# Patient Record
Sex: Female | Born: 1975 | Hispanic: Yes | Marital: Single | Smoking: Never smoker
Health system: Southern US, Community
[De-identification: ages and names within clinical notes are randomized; demographics above are authoritative.]

## PROBLEM LIST (undated history)

## (undated) DIAGNOSIS — R12 Heartburn: Secondary | ICD-10-CM

## (undated) DIAGNOSIS — Z789 Other specified health status: Secondary | ICD-10-CM

## (undated) DIAGNOSIS — K7581 Nonalcoholic steatohepatitis (NASH): Secondary | ICD-10-CM

## (undated) HISTORY — PX: COSMETIC SURGERY: SHX468

---

## 2013-10-18 ENCOUNTER — Ambulatory Visit: Payer: Worker's Compensation | Attending: Orthopedic Surgery | Admitting: Physical Therapy

## 2013-10-18 DIAGNOSIS — M25619 Stiffness of unspecified shoulder, not elsewhere classified: Secondary | ICD-10-CM | POA: Insufficient documentation

## 2013-10-18 DIAGNOSIS — IMO0001 Reserved for inherently not codable concepts without codable children: Secondary | ICD-10-CM | POA: Insufficient documentation

## 2013-10-18 DIAGNOSIS — M25519 Pain in unspecified shoulder: Secondary | ICD-10-CM | POA: Insufficient documentation

## 2013-10-22 ENCOUNTER — Ambulatory Visit: Payer: Worker's Compensation | Attending: Orthopedic Surgery | Admitting: Physical Therapy

## 2013-10-22 DIAGNOSIS — M25619 Stiffness of unspecified shoulder, not elsewhere classified: Secondary | ICD-10-CM | POA: Insufficient documentation

## 2013-10-22 DIAGNOSIS — IMO0001 Reserved for inherently not codable concepts without codable children: Secondary | ICD-10-CM | POA: Insufficient documentation

## 2013-10-22 DIAGNOSIS — M25519 Pain in unspecified shoulder: Secondary | ICD-10-CM | POA: Insufficient documentation

## 2013-10-25 ENCOUNTER — Ambulatory Visit: Payer: Worker's Compensation | Admitting: Physical Therapy

## 2013-10-27 ENCOUNTER — Ambulatory Visit: Payer: Worker's Compensation | Attending: Orthopedic Surgery | Admitting: Physical Therapy

## 2013-10-27 DIAGNOSIS — IMO0001 Reserved for inherently not codable concepts without codable children: Secondary | ICD-10-CM | POA: Insufficient documentation

## 2013-10-27 DIAGNOSIS — M25519 Pain in unspecified shoulder: Secondary | ICD-10-CM | POA: Insufficient documentation

## 2013-10-27 DIAGNOSIS — M25619 Stiffness of unspecified shoulder, not elsewhere classified: Secondary | ICD-10-CM | POA: Insufficient documentation

## 2013-12-16 ENCOUNTER — Encounter (HOSPITAL_COMMUNITY): Payer: Self-pay | Admitting: Emergency Medicine

## 2013-12-16 ENCOUNTER — Observation Stay (HOSPITAL_COMMUNITY)
Admission: EM | Admit: 2013-12-16 | Discharge: 2013-12-19 | Disposition: A | Discharge status: Home / Self Care | Payer: Medicaid Other | Attending: Internal Medicine | Admitting: Internal Medicine

## 2013-12-16 DIAGNOSIS — R0602 Shortness of breath: Secondary | ICD-10-CM | POA: Diagnosis not present

## 2013-12-16 DIAGNOSIS — R12 Heartburn: Secondary | ICD-10-CM | POA: Diagnosis present

## 2013-12-16 DIAGNOSIS — K8010 Calculus of gallbladder with chronic cholecystitis without obstruction: Principal | ICD-10-CM | POA: Insufficient documentation

## 2013-12-16 DIAGNOSIS — Z79899 Other long term (current) drug therapy: Secondary | ICD-10-CM | POA: Diagnosis not present

## 2013-12-16 DIAGNOSIS — K851 Biliary acute pancreatitis without necrosis or infection: Secondary | ICD-10-CM | POA: Diagnosis present

## 2013-12-16 DIAGNOSIS — Z01811 Encounter for preprocedural respiratory examination: Secondary | ICD-10-CM

## 2013-12-16 DIAGNOSIS — Z419 Encounter for procedure for purposes other than remedying health state, unspecified: Secondary | ICD-10-CM

## 2013-12-16 DIAGNOSIS — I10 Essential (primary) hypertension: Secondary | ICD-10-CM | POA: Diagnosis not present

## 2013-12-16 DIAGNOSIS — K812 Acute cholecystitis with chronic cholecystitis: Secondary | ICD-10-CM | POA: Diagnosis present

## 2013-12-16 DIAGNOSIS — R52 Pain, unspecified: Secondary | ICD-10-CM

## 2013-12-16 DIAGNOSIS — R748 Abnormal levels of other serum enzymes: Secondary | ICD-10-CM

## 2013-12-16 DIAGNOSIS — Z886 Allergy status to analgesic agent status: Secondary | ICD-10-CM | POA: Diagnosis not present

## 2013-12-16 DIAGNOSIS — K7581 Nonalcoholic steatohepatitis (NASH): Secondary | ICD-10-CM | POA: Diagnosis present

## 2013-12-16 HISTORY — DX: Heartburn: R12

## 2013-12-16 HISTORY — DX: Other specified health status: Z78.9

## 2013-12-16 HISTORY — DX: Nonalcoholic steatohepatitis (NASH): K75.81

## 2013-12-16 LAB — CBC WITH DIFFERENTIAL/PLATELET
BASOS PCT: 0 % (ref 0–1)
Basophils Absolute: 0 10*3/uL (ref 0.0–0.1)
EOS PCT: 2 % (ref 0–5)
Eosinophils Absolute: 0.2 10*3/uL (ref 0.0–0.7)
HEMATOCRIT: 41.8 % (ref 36.0–46.0)
Hemoglobin: 14.1 g/dL (ref 12.0–15.0)
Lymphocytes Relative: 26 % (ref 12–46)
Lymphs Abs: 2.6 10*3/uL (ref 0.7–4.0)
MCH: 30.7 pg (ref 26.0–34.0)
MCHC: 33.7 g/dL (ref 30.0–36.0)
MCV: 91.1 fL (ref 78.0–100.0)
MONO ABS: 0.6 10*3/uL (ref 0.1–1.0)
Monocytes Relative: 7 % (ref 3–12)
NEUTROS ABS: 6.5 10*3/uL (ref 1.7–7.7)
Neutrophils Relative %: 65 % (ref 43–77)
Platelets: 199 10*3/uL (ref 150–400)
RBC: 4.59 MIL/uL (ref 3.87–5.11)
RDW: 12.4 % (ref 11.5–15.5)
WBC: 9.9 10*3/uL (ref 4.0–10.5)

## 2013-12-16 LAB — COMPREHENSIVE METABOLIC PANEL
ALBUMIN: 3.6 g/dL (ref 3.5–5.2)
ALT: 122 U/L — AB (ref 0–35)
AST: 153 U/L — AB (ref 0–37)
Alkaline Phosphatase: 85 U/L (ref 39–117)
Anion gap: 13 (ref 5–15)
BUN: 17 mg/dL (ref 6–23)
CALCIUM: 9.7 mg/dL (ref 8.4–10.5)
CHLORIDE: 101 meq/L (ref 96–112)
CO2: 28 meq/L (ref 19–32)
CREATININE: 0.66 mg/dL (ref 0.50–1.10)
GFR calc Af Amer: >90 mL/min (ref >90)
GFR calc non Af Amer: >90 mL/min (ref >90)
Glucose, Bld: 98 mg/dL (ref 70–99)
Potassium: 4.1 mEq/L (ref 3.7–5.3)
SODIUM: 142 meq/L (ref 137–147)
Total Bilirubin: 0.3 mg/dL (ref 0.3–1.2)
Total Protein: 7.4 g/dL (ref 6.0–8.3)

## 2013-12-16 LAB — URINALYSIS, ROUTINE W REFLEX MICROSCOPIC
Bilirubin Urine: NEGATIVE
GLUCOSE, UA: NEGATIVE mg/dL
Hgb urine dipstick: NEGATIVE
Ketones, ur: NEGATIVE mg/dL
LEUKOCYTES UA: NEGATIVE
Nitrite: NEGATIVE
PH: 6 (ref 5.0–8.0)
Protein, ur: NEGATIVE mg/dL
Specific Gravity, Urine: 1.012 (ref 1.005–1.030)
Urobilinogen, UA: 0.2 mg/dL (ref 0.0–1.0)

## 2013-12-16 LAB — LIPASE, BLOOD: Lipase: 135 U/L — ABNORMAL HIGH (ref 11–59)

## 2013-12-16 LAB — PREGNANCY, URINE: PREG TEST UR: NEGATIVE

## 2013-12-16 MED ORDER — GI COCKTAIL ~~LOC~~: 30.0000 mL | Freq: Once | ORAL | Status: AC

## 2013-12-16 MED ORDER — GI COCKTAIL ~~LOC~~
30.0000 mL | Freq: Once | ORAL | Status: AC
  Administered 2013-12-16: 30 mL via ORAL
  Filled 2013-12-16: qty 30

## 2013-12-16 NOTE — ED Notes (Signed)
Pt is c/o epigastric pain that started about an hour ago and radiates into her back, right shoulder and arm  Denies vomiting   Pt is c/o feeling bloated

## 2013-12-16 NOTE — ED Notes (Signed)
The following hx is obtained through [employer] for Spanish speaking pt's: Pt reports a sudden onset of sharp mid epigastric pain radiating to her right shoulder and right hand, recently seen for the same symptoms at an urgent care clinic, workup included an US and blood work which she was told they were un remarkable. C/o of shortness of breath with the pain, denies recent cardiac hx but endorses hx of angina. Pt in mild pain induced distress currently rated her a pain at 8/10(0-10)

## 2013-12-16 NOTE — ED Provider Notes (Signed)
CSN: [id number]     Arrival date & time 12/16/13  2110 History   First MD Initiated Contact with Patient 12/16/13 2200     Chief Complaint  Patient presents with  . Abdominal Pain  . Shortness of Breath     (Consider location/radiation/quality/duration/timing/severity/associated sxs/prior Treatment) Patient is a 38 y.o. female presenting with abdominal pain. The history is provided by the patient. No language interpreter was used.  Abdominal Pain Pain location:  Generalized Pain quality: aching   Pain radiates to:  Does not radiate Pain severity:  Moderate Onset quality:  Gradual Timing:  Constant Progression:  Worsening Chronicity:  New Relieved by:  Nothing Worsened by:  Nothing tried Ineffective treatments:  None tried Associated symptoms: no fever     History reviewed. No pertinent past medical history. Past Surgical History  Procedure Laterality Date  . Tubal ligation     History reviewed. No pertinent family history. History  Substance Use Topics  . Smoking status: Never Smoker   . Smokeless tobacco: Not on file  . Alcohol Use: No   OB History    No data available     Review of Systems  Constitutional: Negative for fever.  Gastrointestinal: Positive for abdominal pain.  All other systems reviewed and are negative.     Allergies  Aspirin  Home Medications   Prior to Admission medications   Medication Sig Start Date End Date Taking? Authorizing Provider  ibuprofen (ADVIL,MOTRIN) 200 MG tablet Take 200 mg by mouth every 6 (six) hours as needed for moderate pain.   Yes Historical Provider, MD   BP 128/87 mmHg  Pulse 77  Resp 22  SpO2 99%  LMP  Physical Exam  Constitutional: She is oriented to person, place, and time. She appears well-developed and well-nourished.  HENT:  Head: Normocephalic and atraumatic.  Eyes: Conjunctivae and EOM are normal. Pupils are equal, round, and reactive to light.  Neck: Normal range of motion.  Cardiovascular:  Normal rate and normal heart sounds.   Pulmonary/Chest: Effort normal.  Abdominal: She exhibits no distension. There is tenderness.  Musculoskeletal: Normal range of motion.  Neurological: She is alert and oriented to person, place, and time.  Skin: Skin is warm.  Psychiatric: She has a normal mood and affect.  Nursing note and vitals reviewed.   ED Course  Procedures (including critical care time) Labs Review Labs Reviewed  COMPREHENSIVE METABOLIC PANEL - Abnormal; Notable for the following:    AST 153 (*)    ALT 122 (*)    All other components within normal limits  LIPASE, BLOOD - Abnormal; Notable for the following:    Lipase 135 (*)    All other components within normal limits  URINALYSIS, ROUTINE W REFLEX MICROSCOPIC - Abnormal; Notable for the following:    APPearance CLOUDY (*)    All other components within normal limits  CBC WITH DIFFERENTIAL  PREGNANCY, URINE    Imaging Review No results found.   EKG Interpretation   Date/Time:  Thursday December 16 2013 22:01:39 EST Ventricular Rate:  74 PR Interval:  186 QRS Duration: 87 QT Interval:  366 QTC Calculation: 406 R Axis:   31 Text Interpretation:  Sinus rhythm Normal ECG Confirmed by BEATON  MD,  ROBERT ([id number]) on 12/16/2013 10:05:56 PM      MDM  Pt reports pain improved from 10-4.     Final diagnoses:  Pain        Elson AreasLeslie K Rhesa Forsberg, PA-C 12/17/13 0053  Nelia Shiobert L Beaton,  MD 12/22/13 2139

## 2013-12-17 ENCOUNTER — Encounter (HOSPITAL_COMMUNITY): Payer: Self-pay | Admitting: Internal Medicine

## 2013-12-17 ENCOUNTER — Emergency Department (HOSPITAL_COMMUNITY): Payer: Medicaid Other

## 2013-12-17 ENCOUNTER — Inpatient Hospital Stay (HOSPITAL_COMMUNITY): Payer: Medicaid Other

## 2013-12-17 ENCOUNTER — Other Ambulatory Visit: Payer: Self-pay

## 2013-12-17 DIAGNOSIS — K851 Biliary acute pancreatitis without necrosis or infection: Secondary | ICD-10-CM | POA: Diagnosis present

## 2013-12-17 DIAGNOSIS — R0602 Shortness of breath: Secondary | ICD-10-CM | POA: Diagnosis not present

## 2013-12-17 DIAGNOSIS — K810 Acute cholecystitis: Secondary | ICD-10-CM

## 2013-12-17 DIAGNOSIS — K7581 Nonalcoholic steatohepatitis (NASH): Secondary | ICD-10-CM

## 2013-12-17 DIAGNOSIS — R12 Heartburn: Secondary | ICD-10-CM

## 2013-12-17 DIAGNOSIS — K8020 Calculus of gallbladder without cholecystitis without obstruction: Secondary | ICD-10-CM

## 2013-12-17 DIAGNOSIS — K812 Acute cholecystitis with chronic cholecystitis: Secondary | ICD-10-CM | POA: Diagnosis present

## 2013-12-17 DIAGNOSIS — K859 Acute pancreatitis, unspecified: Secondary | ICD-10-CM

## 2013-12-17 DIAGNOSIS — I10 Essential (primary) hypertension: Secondary | ICD-10-CM | POA: Diagnosis not present

## 2013-12-17 DIAGNOSIS — K8010 Calculus of gallbladder with chronic cholecystitis without obstruction: Secondary | ICD-10-CM | POA: Diagnosis not present

## 2013-12-17 HISTORY — DX: Nonalcoholic steatohepatitis (NASH): K75.81

## 2013-12-17 HISTORY — DX: Heartburn: R12

## 2013-12-17 LAB — CBC WITH DIFFERENTIAL/PLATELET
BASOS ABS: 0 10*3/uL (ref 0.0–0.1)
BASOS PCT: 0 % (ref 0–1)
Eosinophils Absolute: 0.2 10*3/uL (ref 0.0–0.7)
Eosinophils Relative: 3 % (ref 0–5)
HCT: 38.9 % (ref 36.0–46.0)
HEMOGLOBIN: 12.9 g/dL (ref 12.0–15.0)
Lymphocytes Relative: 31 % (ref 12–46)
Lymphs Abs: 2 10*3/uL (ref 0.7–4.0)
MCH: 30.3 pg (ref 26.0–34.0)
MCHC: 33.2 g/dL (ref 30.0–36.0)
MCV: 91.3 fL (ref 78.0–100.0)
Monocytes Absolute: 0.4 10*3/uL (ref 0.1–1.0)
Monocytes Relative: 7 % (ref 3–12)
NEUTROS ABS: 3.9 10*3/uL (ref 1.7–7.7)
NEUTROS PCT: 59 % (ref 43–77)
PLATELETS: 187 10*3/uL (ref 150–400)
RBC: 4.26 MIL/uL (ref 3.87–5.11)
RDW: 12.4 % (ref 11.5–15.5)
WBC: 6.6 10*3/uL (ref 4.0–10.5)

## 2013-12-17 LAB — BASIC METABOLIC PANEL
ANION GAP: 12 (ref 5–15)
BUN: 14 mg/dL (ref 6–23)
CALCIUM: 9.3 mg/dL (ref 8.4–10.5)
CO2: 26 mEq/L (ref 19–32)
Chloride: 100 mEq/L (ref 96–112)
Creatinine, Ser: 0.58 mg/dL (ref 0.50–1.10)
GLUCOSE: 108 mg/dL — AB (ref 70–99)
POTASSIUM: 3.9 meq/L (ref 3.7–5.3)
SODIUM: 138 meq/L (ref 137–147)

## 2013-12-17 LAB — GLUCOSE, CAPILLARY
Glucose-Capillary: 108 mg/dL — ABNORMAL HIGH (ref 70–99)
Glucose-Capillary: 114 mg/dL — ABNORMAL HIGH (ref 70–99)
Glucose-Capillary: 179 mg/dL — ABNORMAL HIGH (ref 70–99)
Glucose-Capillary: 96 mg/dL (ref 70–99)

## 2013-12-17 LAB — SURGICAL PCR SCREEN
MRSA, PCR: NEGATIVE
Staphylococcus aureus: NEGATIVE

## 2013-12-17 LAB — HEPATIC FUNCTION PANEL
ALT: 144 U/L — AB (ref 0–35)
AST: 138 U/L — ABNORMAL HIGH (ref 0–37)
Albumin: 3.3 g/dL — ABNORMAL LOW (ref 3.5–5.2)
Alkaline Phosphatase: 86 U/L (ref 39–117)
BILIRUBIN TOTAL: 0.4 mg/dL (ref 0.3–1.2)
Total Protein: 6.9 g/dL (ref 6.0–8.3)

## 2013-12-17 LAB — LIPASE, BLOOD: LIPASE: 44 U/L (ref 11–59)

## 2013-12-17 MED ORDER — PIPERACILLIN-TAZOBACTAM 3.375 G IVPB
3.3750 g | Freq: Three times a day (TID) | INTRAVENOUS | Status: DC
  Filled 2013-12-17: qty 50

## 2013-12-17 MED ORDER — POLYETHYLENE GLYCOL 3350 17 G PO PACK
17.0000 g | PACK | Freq: Two times a day (BID) | ORAL | Status: DC
  Administered 2013-12-17 (×2): 17 g via ORAL
  Filled 2013-12-17 (×3): qty 1

## 2013-12-17 MED ORDER — ACETAMINOPHEN 650 MG RE SUPP: 650.0000 mg | Freq: Four times a day (QID) | RECTAL | Status: DC | PRN

## 2013-12-17 MED ORDER — ALUM & MAG HYDROXIDE-SIMETH 200-200-20 MG/5ML PO SUSP: 30.0000 mL | Freq: Four times a day (QID) | ORAL | Status: DC | PRN

## 2013-12-17 MED ORDER — PHENOL 1.4 % MT LIQD: 2.0000 | OROMUCOSAL | Status: DC | PRN

## 2013-12-17 MED ORDER — DEXTROSE-NACL 5-0.9 % IV SOLN
INTRAVENOUS | Status: DC
Start: 2013-12-17 — End: 2013-12-18
  Administered 2013-12-17 (×2): via INTRAVENOUS

## 2013-12-17 MED ORDER — ONDANSETRON HCL 4 MG PO TABS: 4.0000 mg | ORAL_TABLET | Freq: Four times a day (QID) | ORAL | Status: DC | PRN

## 2013-12-17 MED ORDER — LACTATED RINGERS IV BOLUS (SEPSIS): 1000.0000 mL | Freq: Three times a day (TID) | INTRAVENOUS | Status: DC | PRN

## 2013-12-17 MED ORDER — METOPROLOL TARTRATE 1 MG/ML IV SOLN: 5.0000 mg | Freq: Four times a day (QID) | INTRAVENOUS | Status: DC | PRN

## 2013-12-17 MED ORDER — SODIUM CHLORIDE 0.9 % IV SOLN
Freq: Once | INTRAVENOUS | Status: AC
  Administered 2013-12-17: 125 mL/h via INTRAVENOUS

## 2013-12-17 MED ORDER — MENTHOL 3 MG MT LOZG
1.0000 | LOZENGE | OROMUCOSAL | Status: DC | PRN
Start: 2013-12-17 — End: 2013-12-19

## 2013-12-17 MED ORDER — ONDANSETRON HCL 4 MG/2ML IJ SOLN
4.0000 mg | Freq: Four times a day (QID) | INTRAMUSCULAR | Status: DC | PRN
  Administered 2013-12-17 – 2013-12-19 (×2): 4 mg via INTRAVENOUS
  Filled 2013-12-17 (×2): qty 2

## 2013-12-17 MED ORDER — MAGIC MOUTHWASH
15.0000 mL | Freq: Four times a day (QID) | ORAL | Status: DC | PRN
  Filled 2013-12-17: qty 15

## 2013-12-17 MED ORDER — CHLORHEXIDINE GLUCONATE 4 % EX LIQD
1.0000 "application " | Freq: Once | CUTANEOUS | Status: AC
  Administered 2013-12-17: 1 via TOPICAL
  Filled 2013-12-17: qty 15

## 2013-12-17 MED ORDER — ONDANSETRON HCL 4 MG/2ML IJ SOLN
4.0000 mg | Freq: Once | INTRAMUSCULAR | Status: AC
  Administered 2013-12-17: 4 mg via INTRAVENOUS
  Filled 2013-12-17: qty 2

## 2013-12-17 MED ORDER — PROMETHAZINE HCL 25 MG/ML IJ SOLN: 6.2500 mg | INTRAMUSCULAR | Status: DC | PRN

## 2013-12-17 MED ORDER — ACETAMINOPHEN 325 MG PO TABS
650.0000 mg | ORAL_TABLET | Freq: Four times a day (QID) | ORAL | Status: DC | PRN
  Administered 2013-12-17 (×2): 650 mg via ORAL
  Filled 2013-12-17 (×2): qty 2

## 2013-12-17 MED ORDER — HYDROMORPHONE HCL 1 MG/ML IJ SOLN
1.0000 mg | Freq: Once | INTRAMUSCULAR | Status: AC
  Administered 2013-12-17: 1 mg via INTRAVENOUS
  Filled 2013-12-17: qty 1

## 2013-12-17 MED ORDER — CEFTRIAXONE SODIUM IN DEXTROSE 40 MG/ML IV SOLN
2.0000 g | INTRAVENOUS | Status: DC
  Administered 2013-12-17 – 2013-12-18 (×2): 2 g via INTRAVENOUS
  Filled 2013-12-17 (×2): qty 50

## 2013-12-17 MED ORDER — SACCHAROMYCES BOULARDII 250 MG PO CAPS
250.0000 mg | ORAL_CAPSULE | Freq: Two times a day (BID) | ORAL | Status: DC
Start: 2013-12-17 — End: 2013-12-18
  Administered 2013-12-17 (×2): 250 mg via ORAL
  Filled 2013-12-17 (×3): qty 1

## 2013-12-17 MED ORDER — PIPERACILLIN-TAZOBACTAM 3.375 G IVPB
3.3750 g | Freq: Once | INTRAVENOUS | Status: DC
  Administered 2013-12-17: 3.375 g via INTRAVENOUS
  Filled 2013-12-17: qty 50

## 2013-12-17 MED ORDER — HYDROMORPHONE HCL 1 MG/ML IJ SOLN: 0.5000 mg | INTRAMUSCULAR | Status: DC | PRN

## 2013-12-17 MED ORDER — LIP MEDEX EX OINT
1.0000 "application " | TOPICAL_OINTMENT | Freq: Two times a day (BID) | CUTANEOUS | Status: DC
  Administered 2013-12-17 – 2013-12-19 (×3): 1 via TOPICAL
  Filled 2013-12-17: qty 7

## 2013-12-17 MED ORDER — INFLUENZA VAC SPLIT QUAD 0.5 ML IM SUSY
0.5000 mL | PREFILLED_SYRINGE | INTRAMUSCULAR | Status: AC
  Administered 2013-12-19: 0.5 mL via INTRAMUSCULAR
  Filled 2013-12-17 (×2): qty 0.5

## 2013-12-17 MED ORDER — CHLORHEXIDINE GLUCONATE 4 % EX LIQD
1.0000 "application " | Freq: Once | CUTANEOUS | Status: AC
  Administered 2013-12-18: 1 via TOPICAL
  Filled 2013-12-17 (×2): qty 15

## 2013-12-17 MED ORDER — DIPHENHYDRAMINE HCL 50 MG/ML IJ SOLN: 12.5000 mg | Freq: Four times a day (QID) | INTRAMUSCULAR | Status: DC | PRN

## 2013-12-17 NOTE — ED Provider Notes (Signed)
CSN: [id number]     Arrival date & time 12/16/13  2110 History   First MD Initiated Contact with Patient 12/16/13 2200     Chief Complaint  Patient presents with  . Abdominal Pain  . Shortness of Breath     (Consider location/radiation/quality/duration/timing/severity/associated sxs/prior Treatment) HPI  History reviewed. No pertinent past medical history. Past Surgical History  Procedure Laterality Date  . Tubal ligation     History reviewed. No pertinent family history. History  Substance Use Topics  . Smoking status: Never Smoker   . Smokeless tobacco: Not on file  . Alcohol Use: No   OB History    No data available     Review of Systems    Allergies  Aspirin  Home Medications   Prior to Admission medications   Medication Sig Start Date End Date Taking? Authorizing Provider  ibuprofen (ADVIL,MOTRIN) 200 MG tablet Take 200 mg by mouth every 6 (six) hours as needed for moderate pain.   Yes Historical Provider, MD   BP 118/52 mmHg  Pulse 74  Resp 14  SpO2 98%  LMP  Physical Exam  ED Course  Procedures (including critical care time) Labs Review Labs Reviewed  COMPREHENSIVE METABOLIC PANEL - Abnormal; Notable for the following:    AST 153 (*)    ALT 122 (*)    All other components within normal limits  LIPASE, BLOOD - Abnormal; Notable for the following:    Lipase 135 (*)    All other components within normal limits  URINALYSIS, ROUTINE W REFLEX MICROSCOPIC - Abnormal; Notable for the following:    APPearance CLOUDY (*)    All other components within normal limits  CBC WITH DIFFERENTIAL  PREGNANCY, URINE    Imaging Review Koreas Abdomen Complete  12/17/2013   CLINICAL DATA:  Recurrent sharp mid epigastric pain, bloating, no vomiting.  EXAM: ULTRASOUND ABDOMEN COMPLETE  COMPARISON:  None.  FINDINGS: Gallbladder: Multiple echogenic gallstones, difficult to discretely measure, demonstrating acoustic shadowing. Mild gallbladder distention with bowel wall  thickening or pericholecystic fluid. No sonographic Murphy's sign elicited.  Common bile duct: Diameter: 5 mm  Liver: Diffusely echogenic without intrahepatic biliary dilatation. Hepatopetal portal vein.  IVC: No abnormality visualized.  Pancreas: Obscured, likely by bowel gas.  Spleen: Size and appearance within normal limits.  Right Kidney: Length: 9.6 cm. Echogenicity within normal limits. No mass or hydronephrosis visualized.  Left Kidney: Length: 10.5 cm. Echogenicity within normal limits. No mass or hydronephrosis visualized.  Abdominal aorta: Obscured, likely by bowel gas.  Other findings: None.  IMPRESSION: Cholelithiasis and mild gallbladder distention without sonographic findings of acute cholecystitis.  Hepatic steatosis.   Electronically Signed   By: Awilda Metroourtnay  Bloomer   On: 12/17/2013 01:06     EKG Interpretation   Date/Time:  Thursday December 16 2013 22:01:39 EST Ventricular Rate:  74 PR Interval:  186 QRS Duration: 87 QT Interval:  366 QTC Calculation: 406 R Axis:   31 Text Interpretation:  Sinus rhythm Normal ECG Confirmed by BEATON  MD,  ROBERT ([id number]) on 12/16/2013 10:05:56 PM     Patient has multiple gallstones with an elevated lipase.  I spoke with Dr. gross.  He was requesting medical admission and management.  He will come assess the patient later this morning.  Most likely take her to the OR late morning early afternoon.  She's been made nothing by mouth. I spoke [facility], who wouldn't the patient.  He is requesting antibiotic MDM   Final diagnoses:  Pain  Cholecystitis with cholelithiasis  Elevated lipase         Arman FilterGail K Nyesha Cliff, NP 12/17/13 [id number]  Olivia Mackielga M Otter, MD 12/17/13 [phone]

## 2013-12-17 NOTE — Progress Notes (Signed)
[facility] phones provided for patient.  Education materials, acute pancreatitis and cholecystitis, were printed off in Spanish and given to the patient.  Patient instructed about how much oral fluids to take and about saving urine for us to monitor strict I/Os. Patient and family understood.  Instructed patient to call RN if pain/nausea increased.  Informed consent not obtain at this time because patient stated she wanted to talk to the doctor again.  Will continue to monitor.

## 2013-12-17 NOTE — Progress Notes (Signed)
Pt admitted after midnight, pease see Dr. Toniann FailKakrakandy admission note.   1. Acute cholecystitis with possible gallstone pancreatitis - keep NPO in  An anticipation surgery today. Continue IV Zosyn. Close follow up on LFT's.  Debbora PrestoMAGICK-Aryn Safran, MD  [facility] Pager [phone]  If 7PM-7AM, please contact night-coverage [email or web address] Password [email or web address]

## 2013-12-17 NOTE — Progress Notes (Signed)
We will not be able to do her today, so she is post for 0730 tomorrow.  I will let her have clears till MN.

## 2013-12-17 NOTE — H&P (Signed)
[facility] History and Physical  Lorita OfficerJuana Leon YNW:[id number]:[id number] DOB: 1975-10-03 DOA: 12/16/2013  Referring physician: ER physician. PCP: No primary care provider on file.  Chief Complaint: Abdominal pain.  Engineer, structuralpanish translator used.  HPI: Lorita OfficerJuana Leon is a 38 y.o. female with no significant past medical history started developing sudden onset of right upper quadrant and epigastric pain. There was no associated nausea vomiting or diarrhea and denies any associated fever chills. In the ER patient's labs revealed elevated liver function and lipase. Sonogram shows features concerning for acute cholecystitis. On-call surgeon Dr. Michaell CowingGross was consulted by the ER physician and patient has been admitted for further management. Patient denies any chest pain shortness of breath.   Review of Systems: As presented in the history of presenting illness, rest negative.  Past Medical History  Diagnosis Date  . Medical history non-contributory    Past Surgical History  Procedure Laterality Date  . Tubal ligation     Social History:  reports that she has never smoked. She does not have any smokeless tobacco history on file. She reports that she does not drink alcohol or use illicit drugs. Where does patient live home. Can patient participate in ADLs? Yes.  Allergies  Allergen Reactions  . Aspirin Anaphylaxis    Family History:  Family History  Problem Relation Age of Onset  . Diabetes Mellitus II Neg Hx       Prior to Admission medications   Medication Sig Start Date End Date Taking? Authorizing Provider  ibuprofen (ADVIL,MOTRIN) 200 MG tablet Take 200 mg by mouth every 6 (six) hours as needed for moderate pain.   Yes Historical Provider, MD    Physical Exam: Filed Vitals:   12/16/13 2122 12/17/13 0149 12/17/13 0416  BP: 128/87 118/52 111/62  Pulse: 77 74 75  Temp:   98.2 F (36.8 C)  TempSrc: Oral  Oral  Resp: 22 14 16   Height:   5\' 6"  (1.676 m)  Weight:   80.151 kg (176 lb 11.2 oz)   SpO2: 99% 98% 98%     General:  Well-developed and nourished.  Eyes: Anicteric no pallor.  ENT: No discharge from the ears eyes nose and mouth.  Neck: No mass felt.  Cardiovascular: S1-S2 heard.  Respiratory: No rhonchi or crepitations.  Abdomen: Tenderness in the right upper quadrant and epigastric area no guarding or rigidity bowel sounds present.  Skin: No rash.  Musculoskeletal: No edema.  Psychiatric: Appears normal.  Neurologic: Alert awake oriented to time place and person. Moves all extremities.  Labs on Admission:  Basic Metabolic Panel:  Recent Labs Lab 12/16/13 2226  NA 142  K 4.1  CL 101  CO2 28  GLUCOSE 98  BUN 17  CREATININE 0.66  CALCIUM 9.7   Liver Function Tests:  Recent Labs Lab 12/16/13 2226  AST 153*  ALT 122*  ALKPHOS 85  BILITOT 0.3  PROT 7.4  ALBUMIN 3.6    Recent Labs Lab 12/16/13 2226  LIPASE 135*   No results for input(s): AMMONIA in the last 168 hours. CBC:  Recent Labs Lab 12/16/13 2226  WBC 9.9  NEUTROABS 6.5  HGB 14.1  HCT 41.8  MCV 91.1  PLT 199   Cardiac Enzymes: No results for input(s): CKTOTAL, CKMB, CKMBINDEX, TROPONINI in the last 168 hours.  BNP (last 3 results) No results for input(s): PROBNP in the last 8760 hours. CBG: No results for input(s): GLUCAP in the last 168 hours.  Radiological Exams on Admission: Koreas Abdomen Complete  12/17/2013  CLINICAL DATA:  Recurrent sharp mid epigastric pain, bloating, no vomiting.  EXAM: ULTRASOUND ABDOMEN COMPLETE  COMPARISON:  None.  FINDINGS: Gallbladder: Multiple echogenic gallstones, difficult to discretely measure, demonstrating acoustic shadowing. Mild gallbladder distention with bowel wall thickening or pericholecystic fluid. No sonographic Murphy's sign elicited.  Common bile duct: Diameter: 5 mm  Liver: Diffusely echogenic without intrahepatic biliary dilatation. Hepatopetal portal vein.  IVC: No abnormality visualized.  Pancreas: Obscured, likely by  bowel gas.  Spleen: Size and appearance within normal limits.  Right Kidney: Length: 9.6 cm. Echogenicity within normal limits. No mass or hydronephrosis visualized.  Left Kidney: Length: 10.5 cm. Echogenicity within normal limits. No mass or hydronephrosis visualized.  Abdominal aorta: Obscured, likely by bowel gas.  Other findings: None.  IMPRESSION: Cholelithiasis and mild gallbladder distention without sonographic findings of acute cholecystitis.  Hepatic steatosis.   Electronically Signed   By: Awilda Metroourtnay  Bloomer   On: 12/17/2013 01:06     Assessment/Plan Principal Problem:   Cholecystitis, acute Active Problems:   Cholelithiasis   Gallstone pancreatitis   Acute cholecystitis   1. Acute cholecystitis with possible gallstone pancreatitis - patient has been kept nothing by mouth in anticipation of possible surgery. Patient has been placed on Zosyn IV antibiotics. Continue gentle hydration and pain relieving medications. Further recommendations per surgery. 2. Elevated LFTs - phone #1. Closely follow LFTs for any further worsening as it may indicate CBD stone.    Code Status: Full code.  Family Communication: None.  Disposition Plan: Admit to inpatient.    KAKRAKANDY,ARSHAD N. [facility] Pager [phone].  If 7PM-7AM, please contact night-coverage [email or web address] Password [email or web address] 12/17/2013, 4:29 AM

## 2013-12-17 NOTE — Discharge Instructions (Signed)
Colecistectomía laparoscópica - Cuidados posteriores °(Laparoscopic Cholecystectomy, Care After) °Siga estas instrucciones durante las próximas semanas. Estas indicaciones le proporcionan información general acerca de cómo deberá cuidarse después del procedimiento. El médico también podrá darle instrucciones más específicas. El tratamiento ha sido planificado según las prácticas médicas actuales, pero en algunos casos pueden ocurrir problemas. Comuníquese con el médico si tiene algún problema o tiene dudas después del procedimiento. °QUÉ ESPERAR DESPUÉS DEL PROCEDIMIENTO °Después del procedimiento, es común tener las siguientes sensaciones: °· Dolor en los lugares de la incisión. Le darán analgésicos para controlar el dolor. °· Náuseas o vómitos leves. Estos síntomas deberían mejorar después de las primeras 24 horas. °· Meteorismo y posiblemente dolor en el hombro debido al gas que se usa durante el procedimiento. Estos síntomas mejorarán después de las primeras 24 horas. °INSTRUCCIONES PARA EL CUIDADO EN EL HOGAR  °· Cambie los apósitos (vendajes) tal como le indicó el médico. °· Mantenga la herida limpia y seca. Puede lavar la herida suavemente con agua y jabón. Seque dando palmaditas suaves. °· No se bañe en la bañera, no practique natación ni use el jacuzzy durante 2 semanas o hasta que lo autorice el médico. °· Tome solo medicamentos de venta libre o recetados, según las indicaciones del médico. °· Siga su dieta normal según las indicaciones de su médico. °· No levante ningún objeto que pese más de 10 libras (4,5 kg) hasta que el médico lo autorice. °· No practique deportes de contacto durante 1 semana o hasta que el médico lo autorice. °SOLICITE ATENCIÓN MÉDICA SI:  °· Presenta enrojecimiento, hinchazón o aumento del dolor en la herida. °· Observa una secreción de color blanco amarillento (pus) en la herida. °· Hay una secreción en la herida que dura más de 1 día. °· Advierte un olor fétido que proviene de la  herida o del vendaje. °· Los cortes quirúrgicos (incisiones) se abren. °SOLICITE ATENCIÓN MÉDICA DE INMEDIATO SI:  °· Le aparece una erupción cutánea. °· Tiene dificultad para respirar. °· Siente dolor en el pecho. °· Tiene fiebre. °· Nota un incremento del dolor en los hombros (en la zona donde van los breteles). °· Presenta episodios de mareos o se siente débil cuando está de pie. °· Siente un dolor abdominal intenso. °· Tiene malestar estomacal (náuseas) o vomita y esto dura más de 1 día. °Document Released: 08/27/2010 Document Revised: 11/04/2012 °ExitCare® Patient Information ©2015 [employer]. This information is not intended to replace advice given to you by your health care provider. Make sure you discuss any questions you have with your health care provider. °CCS ______[facility] °LAPAROSCOPIC SURGERY: POST OP INSTRUCTIONS °Always review your discharge instruction sheet given to you by the facility where your surgery was performed. °IF YOU HAVE DISABILITY OR FAMILY LEAVE FORMS, YOU MUST BRING THEM TO THE OFFICE FOR PROCESSING.   °DO NOT GIVE THEM TO YOUR DOCTOR. ° °1. A prescription for pain medication may be given to you upon discharge.  Take your pain medication as prescribed, if needed.  If narcotic pain medicine is not needed, then you may take acetaminophen (Tylenol) or ibuprofen (Advil) as needed. °2. Take your usually prescribed medications unless otherwise directed. °3. If you need a refill on your pain medication, please contact your pharmacy.  They will contact our office to request authorization. Prescriptions will not be filled after 5pm or on week-ends. °4. You should follow a light diet the first few days after arrival home, such as soup and crackers, etc.  Be sure to include lots of fluids daily. °5.   Most patients will experience some swelling and bruising in the area of the incisions.  Ice packs will help.  Swelling and bruising can take several days to resolve.  °6. It  is common to experience some constipation if taking pain medication after surgery.  Increasing fluid intake and taking a stool softener (such as Colace) will usually help or prevent this problem from occurring.  A mild laxative (Milk of Magnesia or Miralax) should be taken according to package instructions if there are no bowel movements after 48 hours. °7. Unless discharge instructions indicate otherwise, you may remove your bandages 24-48 hours after surgery, and you may shower at that time.  You may have steri-strips (small skin tapes) in place directly over the incision.  These strips should be left on the skin for 7-10 days.  If your surgeon used skin glue on the incision, you may shower in 24 hours.  The glue will flake off over the next 2-3 weeks.  Any sutures or staples will be removed at the office during your follow-up visit. °8. ACTIVITIES:  You may resume regular (light) daily activities beginning the next day--such as daily self-care, walking, climbing stairs--gradually increasing activities as tolerated.  You may have sexual intercourse when it is comfortable.  Refrain from any heavy lifting or straining until approved by your doctor. °a. You may drive when you are no longer taking prescription pain medication, you can comfortably wear a seatbelt, and you can safely maneuver your car and apply brakes. °b. RETURN TO WORK:  __________________________________________________________ °9. You should see your doctor in the office for a follow-up appointment approximately 2-3 weeks after your surgery.  Make sure that you call for this appointment within a day or two after you arrive home to insure a convenient appointment time. °10. OTHER INSTRUCTIONS: __________________________________________________________________________________________________________________________ __________________________________________________________________________________________________________________________ °WHEN TO CALL  YOUR DOCTOR: °1. Fever over 101.0 °2. Inability to urinate °3. Continued bleeding from incision. °4. Increased pain, redness, or drainage from the incision. °5. Increasing abdominal pain ° °The clinic staff is available to answer your questions during regular business hours.  Please don’t hesitate to call and ask to speak to one of the nurses for clinical concerns.  If you have a medical emergency, go to the nearest emergency room or call 911.  A surgeon from [facility] is always on call at the hospital. °[address], [address], [place], [place]  [address] ? [address] [address], [place], [place]   [address] °[phone] ? [phone] ? FAX [phone] °Web site: [email or web address] ° °

## 2013-12-17 NOTE — Care Management Note (Signed)
    Page 1 of 1   12/17/2013     8:26:14 PM CARE MANAGEMENT NOTE 12/17/2013  Patient:  Ashley Owens,Ashley Owens   Account Number:  [id number]  Date Initiated:  12/17/2013  Documentation initiated by:  Lanier ClamMAHABIR,Burnett Spray  Subjective/Objective Assessment:   38 Y/O F ADMITTED W/PANCREATITIS.     Action/Plan:   FROM HOME.SPANISH SPEAKING.   Anticipated DC Date:  12/20/2013   Anticipated DC Plan:  HOME/SELF CARE  In-house referral  Interpreting Services      DC Planning Services  CM consult      Choice offered to / List presented to:             Status of service:  In process, will continue to follow Medicare Important Message given?   (If response is "NO", the following Medicare IM given date fields will be blank) Date Medicare IM given:   Medicare IM given by:   Date Additional Medicare IM given:   Additional Medicare IM given by:    Discharge Disposition:    Per UR Regulation:  Reviewed for med. necessity/level of care/duration of stay  If discussed at Long Length of Stay Meetings, dates discussed:    Comments:  12/17/13 Taren Toops RN,BSN NCM [phone] NO ANTICIPATED DC NEEDS.

## 2013-12-17 NOTE — Progress Notes (Signed)
ANTIBIOTIC CONSULT NOTE - INITIAL  Pharmacy Consult for Zosyn Indication: Intra-abdominal infection  Allergies  Allergen Reactions  . Aspirin Anaphylaxis    Patient Measurements: Height: 5\' 6"  (167.6 cm) Weight: 176 lb 11.2 oz (80.151 kg) IBW/kg (Calculated) : 59.3   Vital Signs: Temp: 98.2 F (36.8 C) (11/20 0416) Temp Source: Oral (11/20 0416) BP: 111/62 mmHg (11/20 0416) Pulse Rate: 75 (11/20 0416) Intake/Output from previous day:   Intake/Output from this shift:    Labs:  Recent Labs  12/16/13 2226  WBC 9.9  HGB 14.1  PLT 199  CREATININE 0.66   Estimated Creatinine Clearance: 99.9 mL/min (by C-G formula based on Cr of 0.66). No results for input(s): VANCOTROUGH, VANCOPEAK, VANCORANDOM, GENTTROUGH, GENTPEAK, GENTRANDOM, TOBRATROUGH, TOBRAPEAK, TOBRARND, AMIKACINPEAK, AMIKACINTROU, AMIKACIN in the last 72 hours.   Microbiology: No results found for this or any previous visit (from the past 720 hour(s)).  Medical History: Past Medical History  Diagnosis Date  . Medical history non-contributory     Medications:  Scheduled:  . [START ON 12/18/2013] Influenza vac split quadrivalent PF  0.5 mL Intramuscular Tomorrow-1000  . piperacillin-tazobactam (ZOSYN)  IV  3.375 g Intravenous Once  . piperacillin-tazobactam (ZOSYN)  IV  3.375 g Intravenous Q8H   Infusions:  . dextrose 5 % and 0.9% NaCl     Assessment: 38 yoF c/o RUQ and epigastric pain. Zosyn per Rx for intra-abdominal pain.   Goal of Therapy:  Treat infection  Plan:   Zosyn 3.375 Gm IV q8h EI infusion  F/u SCr/cultures as needed  Susanne GreenhouseGreen, Sadiyah Kangas R 12/17/2013,4:59 AM

## 2013-12-17 NOTE — Plan of Care (Signed)
Problem: Phase I Progression Outcomes Goal: Voiding-avoid urinary catheter unless indicated Outcome: Completed/Met Date Met:  12/17/13

## 2013-12-17 NOTE — Consult Note (Signed)
[address] [place], [place] [address] Phone: [phone] FAX: [phone]     Ashley Owens  01-Jul-1975 [id number]  CARE TEAM:  PCP: No primary care provider on file.  Outpatient Care Team: No care team member to display  Inpatient Treatment Team: Treatment Team: Attending Provider: Theodis Blaze, MD; Consulting Physician: Nolon Nations, MD; Registered Nurse: Joellen Jersey, RN; Rounding Team: Ian Bushman, MD; Technician: Fuller Plan  This patient is a 38 y.o.female who presents today for surgical evaluation at the request of Florene Glen. NP & Linton Flemings, MD.   Reason for evaluation: Abdominal pain & increased LFTs  Pleasant Hispanic female that speaks no [place].  Here with her husband and is bilingual.  Has had history of intermittent nausea and vomiting to heavy greasy foods.  Had episode of severe chest and upper abdominal pain.  Became more focal in the right upper quadrant.  Felt bloated and nauseated.  Did not vomit.  Pain was very intense.  Never had anything like this before.  Came to the emergency room.  She does have a history of heartburn and reflux that is usually mild.  This did not seem like this.  She had no improvement really with a GI cocktail.  Ultrasound showed numerous gallstones.  Elevated liver function tests and lipase.  Surgical consultation requested.  Patient denies any history of Crohn's or inflammatory bowel disease.  No sick contacts or travel history.  Normally has 2 bowel movements a day.  She had 2 C-sections but no other abdominal surgery.  No history of urinary or menstrual problems that she is aware of.  She can walk without difficulty.  No history of cardiopulmonary issues.  She does not smoke cigarettes.  Past Medical History  Diagnosis Date  . Medical history non-contributory     Past Surgical History  Procedure Laterality Date  . Cesarean section      x2    History    Social History  . Marital Status: Single    Spouse Name: N/A    Number of Children: N/A  . Years of Education: N/A   Occupational History  . Not on file.   Social History Main Topics  . Smoking status: Never Smoker   . Smokeless tobacco: Not on file  . Alcohol Use: No  . Drug Use: No  . Sexual Activity: Not on file   Other Topics Concern  . Not on file   Social History Narrative    Family History  Problem Relation Age of Onset  . Diabetes Mellitus II Neg Hx     Current Facility-Administered Medications  Medication Dose Route Frequency Provider Last Rate Last Dose  . acetaminophen (TYLENOL) tablet 650 mg  650 mg Oral Q6H PRN Rise Patience, MD       Or  . acetaminophen (TYLENOL) suppository 650 mg  650 mg Rectal Q6H PRN Rise Patience, MD      . dextrose 5 %-0.9 % sodium chloride infusion   Intravenous Continuous Rise Patience, MD 100 mL/hr at 12/17/13 0535    . HYDROmorphone (DILAUDID) injection 0.5 mg  0.5 mg Intravenous Q3H PRN Rise Patience, MD      . Derrill Memo ON 12/18/2013] Influenza vac split quadrivalent PF (FLUARIX) injection 0.5 mL  0.5 mL Intramuscular Tomorrow-1000 Rise Patience, MD      . ondansetron [facility]) tablet 4 mg  4 mg Oral Q6H PRN  Rise Patience, MD       Or  . ondansetron [facility]) injection 4 mg  4 mg Intravenous Q6H PRN Rise Patience, MD      . piperacillin-tazobactam (ZOSYN) IVPB 3.375 g  3.375 g Intravenous Once Garald Balding, NP 12.5 mL/hr at 12/17/13 0338 3.375 g at 12/17/13 0338  . piperacillin-tazobactam (ZOSYN) IVPB 3.375 g  3.375 g Intravenous Q8H Dorrene German, [facility]         Allergies  Allergen Reactions  . Aspirin Anaphylaxis    ROS: Constitutional:  No fevers, chills, sweats.  Weight stable Eyes:  No vision changes, No discharge HENT:  No sore throats, nasal drainage Lymph: No neck swelling, No bruising easily Pulmonary:  No cough, productive sputum CV: No orthopnea, PND  Patient walks 30  minutes for about 1 miles without difficulty.  No exertional chest/neck/shoulder/arm pain. GI: No personal nor family history of GI/colon cancer, inflammatory bowel disease, irritable bowel syndrome, allergy such as Celiac Sprue, dietary/dairy problems, colitis, ulcers nor gastritis.  No recent sick contacts/gastroenteritis.  No travel outside the country.  No changes in diet. Renal: No UTIs, No hematuria Genital:  No drainage, bleeding, masses Musculoskeletal: No severe joint pain.  Good ROM major joints Skin:  No sores or lesions.  No rashes Heme/Lymph:  No easy bleeding.  No swollen lymph nodes Neuro: No focal weakness/numbness.  No seizures Psych: No suicidal ideation.  No hallucinations  BP 111/62 mmHg  Pulse 75  Temp(Src) 98.2 F (36.8 C) (Oral)  Resp 16  Ht 5' 6"  (1.676 m)  Wt 176 lb 11.2 oz (80.151 kg)  BMI 28.53 kg/m2  SpO2 98%  LMP   Physical Exam: General: Pt awake/alert/oriented x4 in no major acute distress.  Tired but not toxic. Eyes: PERRL, normal EOM. Sclera nonicteric Neuro: CN II-XII intact w/o focal sensory/motor deficits. Lymph: No head/neck/groin lymphadenopathy Psych:  No delerium/psychosis/paranoia HENT: Normocephalic, Mucus membranes moist.  No thrush Neck: Supple, No tracheal deviation Chest: No pain.  Good respiratory excursion. CV:  Pulses intact.  Regular rhythm Abdomen: Soft, Nondistended.  Min tender in RUQ.  No incarcerated hernias. Ext:  SCDs BLE.  No significant edema.  No cyanosis Skin: No petechiae / purpurea.  No major sores Musculoskeletal: No severe joint pain.  Good ROM major joints   Results:   Labs: Results for orders placed or performed during the hospital encounter of 12/16/13 (from the past 48 hour(s))  Urinalysis, Routine w reflex microscopic     Status: Abnormal   Collection Time: 12/16/13 10:23 PM  Result Value Ref Range   Color, Urine YELLOW YELLOW   APPearance CLOUDY (A) CLEAR   Specific Gravity, Urine 1.012 1.005 - 1.030    pH 6.0 5.0 - 8.0   Glucose, UA NEGATIVE NEGATIVE mg/dL   Hgb urine dipstick NEGATIVE NEGATIVE   Bilirubin Urine NEGATIVE NEGATIVE   Ketones, ur NEGATIVE NEGATIVE mg/dL   Protein, ur NEGATIVE NEGATIVE mg/dL   Urobilinogen, UA 0.2 0.0 - 1.0 mg/dL   Nitrite NEGATIVE NEGATIVE   Leukocytes, UA NEGATIVE NEGATIVE    Comment: MICROSCOPIC NOT DONE ON URINES WITH NEGATIVE PROTEIN, BLOOD, LEUKOCYTES, NITRITE, OR GLUCOSE <1000 mg/dL.  Pregnancy, urine     Status: None   Collection Time: 12/16/13 10:23 PM  Result Value Ref Range   Preg Test, Ur NEGATIVE NEGATIVE    Comment:        THE SENSITIVITY OF THIS METHODOLOGY IS >20 mIU/mL.   CBC with Differential  Status: None   Collection Time: 12/16/13 10:26 PM  Result Value Ref Range   WBC 9.9 4.0 - 10.5 K/uL   RBC 4.59 3.87 - 5.11 MIL/uL   Hemoglobin 14.1 12.0 - 15.0 g/dL   HCT 41.8 36.0 - 46.0 %   MCV 91.1 78.0 - 100.0 fL   MCH 30.7 26.0 - 34.0 pg   MCHC 33.7 30.0 - 36.0 g/dL   RDW 12.4 11.5 - 15.5 %   Platelets 199 150 - 400 K/uL   Neutrophils Relative % 65 43 - 77 %   Neutro Abs 6.5 1.7 - 7.7 K/uL   Lymphocytes Relative 26 12 - 46 %   Lymphs Abs 2.6 0.7 - 4.0 K/uL   Monocytes Relative 7 3 - 12 %   Monocytes Absolute 0.6 0.1 - 1.0 K/uL   Eosinophils Relative 2 0 - 5 %   Eosinophils Absolute 0.2 0.0 - 0.7 K/uL   Basophils Relative 0 0 - 1 %   Basophils Absolute 0.0 0.0 - 0.1 K/uL  Comprehensive metabolic panel     Status: Abnormal   Collection Time: 12/16/13 10:26 PM  Result Value Ref Range   Sodium 142 137 - 147 mEq/L   Potassium 4.1 3.7 - 5.3 mEq/L   Chloride 101 96 - 112 mEq/L   CO2 28 19 - 32 mEq/L   Glucose, Bld 98 70 - 99 mg/dL   BUN 17 6 - 23 mg/dL   Creatinine, Ser 0.66 0.50 - 1.10 mg/dL   Calcium 9.7 8.4 - 10.5 mg/dL   Total Protein 7.4 6.0 - 8.3 g/dL   Albumin 3.6 3.5 - 5.2 g/dL   AST 153 (H) 0 - 37 U/L   ALT 122 (H) 0 - 35 U/L   Alkaline Phosphatase 85 39 - 117 U/L   Total Bilirubin 0.3 0.3 - 1.2 mg/dL   GFR  calc non Af Amer >90 >90 mL/min   GFR calc Af Amer >90 >90 mL/min    Comment: (NOTE) The eGFR has been calculated using the CKD EPI equation. This calculation has not been validated in all clinical situations. eGFR's persistently <90 mL/min signify possible Chronic Kidney Disease.    Anion gap 13 5 - 15  Lipase, blood     Status: Abnormal   Collection Time: 12/16/13 10:26 PM  Result Value Ref Range   Lipase 135 (H) 11 - 59 U/L  Hepatic function panel     Status: Abnormal   Collection Time: 12/17/13  5:35 AM  Result Value Ref Range   Total Protein 6.9 6.0 - 8.3 g/dL   Albumin 3.3 (L) 3.5 - 5.2 g/dL   AST 138 (H) 0 - 37 U/L   ALT 144 (H) 0 - 35 U/L   Alkaline Phosphatase 86 39 - 117 U/L   Total Bilirubin 0.4 0.3 - 1.2 mg/dL   Bilirubin, Direct <0.2 0.0 - 0.3 mg/dL   Indirect Bilirubin NOT CALCULATED 0.3 - 0.9 mg/dL  CBC with Differential     Status: None   Collection Time: 12/17/13  5:35 AM  Result Value Ref Range   WBC 6.6 4.0 - 10.5 K/uL   RBC 4.26 3.87 - 5.11 MIL/uL   Hemoglobin 12.9 12.0 - 15.0 g/dL   HCT 38.9 36.0 - 46.0 %   MCV 91.3 78.0 - 100.0 fL   MCH 30.3 26.0 - 34.0 pg   MCHC 33.2 30.0 - 36.0 g/dL   RDW 12.4 11.5 - 15.5 %   Platelets 187 150 -  400 K/uL   Neutrophils Relative % 59 43 - 77 %   Neutro Abs 3.9 1.7 - 7.7 K/uL   Lymphocytes Relative 31 12 - 46 %   Lymphs Abs 2.0 0.7 - 4.0 K/uL   Monocytes Relative 7 3 - 12 %   Monocytes Absolute 0.4 0.1 - 1.0 K/uL   Eosinophils Relative 3 0 - 5 %   Eosinophils Absolute 0.2 0.0 - 0.7 K/uL   Basophils Relative 0 0 - 1 %   Basophils Absolute 0.0 0.0 - 0.1 K/uL  Lipase, blood     Status: None   Collection Time: 12/17/13  5:35 AM  Result Value Ref Range   Lipase 44 11 - 59 U/L  Basic metabolic panel     Status: Abnormal   Collection Time: 12/17/13  5:35 AM  Result Value Ref Range   Sodium 138 137 - 147 mEq/L   Potassium 3.9 3.7 - 5.3 mEq/L   Chloride 100 96 - 112 mEq/L   CO2 26 19 - 32 mEq/L   Glucose, Bld 108  (H) 70 - 99 mg/dL   BUN 14 6 - 23 mg/dL   Creatinine, Ser 0.58 0.50 - 1.10 mg/dL   Calcium 9.3 8.4 - 10.5 mg/dL   GFR calc non Af Amer >90 >90 mL/min   GFR calc Af Amer >90 >90 mL/min    Comment: (NOTE) The eGFR has been calculated using the CKD EPI equation. This calculation has not been validated in all clinical situations. eGFR's persistently <90 mL/min signify possible Chronic Kidney Disease.    Anion gap 12 5 - 15    Imaging / Studies: US Abdomen Complete  12/17/2013   CLINICAL DATA:  Recurrent sharp mid epigastric pain, bloating, no vomiting.  EXAM: ULTRASOUND ABDOMEN COMPLETE  COMPARISON:  None.  FINDINGS: Gallbladder: Multiple echogenic gallstones, difficult to discretely measure, demonstrating acoustic shadowing. Mild gallbladder distention with bowel wall thickening or pericholecystic fluid. No sonographic Murphy's sign elicited.  Common bile duct: Diameter: 5 mm  Liver: Diffusely echogenic without intrahepatic biliary dilatation. Hepatopetal portal vein.  IVC: No abnormality visualized.  Pancreas: Obscured, likely by bowel gas.  Spleen: Size and appearance within normal limits.  Right Kidney: Length: 9.6 cm. Echogenicity within normal limits. No mass or hydronephrosis visualized.  Left Kidney: Length: 10.5 cm. Echogenicity within normal limits. No mass or hydronephrosis visualized.  Abdominal aorta: Obscured, likely by bowel gas.  Other findings: None.  IMPRESSION: Cholelithiasis and mild gallbladder distention without sonographic findings of acute cholecystitis.  Hepatic steatosis.   Electronically Signed   By: Elon Alas   On: 12/17/2013 01:06    Medications / Allergies: per chart  Antibiotics: Anti-infectives    Start     Dose/Rate Route Frequency Ordered Stop   12/17/13 1200  piperacillin-tazobactam (ZOSYN) IVPB 3.375 g     3.375 g12.5 mL/hr over 240 Minutes Intravenous Every 8 hours 12/17/13 0456     12/17/13 0330  piperacillin-tazobactam (ZOSYN) IVPB 3.375 g      3.375 g12.5 mL/hr over 240 Minutes Intravenous  Once 12/17/13 0319        Assessment  Ashley Owens  38 y.o. 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  Possible acute component as well.  Plan:  Agree with admission.  IV fluid resuscitation.  Pain and nausea control.  Follow exam and lipase and LFTs.  Once pain and lipase resolved, plan cholecystectomy.  Her pain is improved already but she is been taking narcotics rather regularly as well so hard to assess.  Possibly cholecystectomy later today but most likely we will have to wait until tomorrow.  If evidence of choledocholithiasis on intraoperative cholangiogram, plan gastrointestinal consultation with ERCP.  I did discuss with the patient through her husband.  Did discuss with the nurse about having Spanish interpreters available for further clarification if needed.  Try to give literature in Spanish as well.  Questions answered.  They expressed understanding and wished to proceed with surgery per our recommendations:  The anatomy & physiology of hepatobiliary & pancreatic function was discussed.  The pathophysiology of gallbladder dysfunction was discussed.  Natural history risks without surgery was discussed.   I feel the risks of no intervention will lead to serious problems that outweigh the operative risks; therefore, I recommended cholecystectomy to remove the pathology.  I explained laparoscopic techniques with possible need for an open approach.  Probable cholangiogram to evaluate the bilary tract was explained as well.    Risks such as bleeding, infection, abscess, leak, injury to other organs, need for further treatment, stroke, heart attack, death, and other risks were discussed.  I noted a good likelihood this will help address the problem.   Possibility that this will not correct all abdominal symptoms was explained.  Goals of post-operative recovery were discussed as well.  We will work to minimize complications.  An educational handout further explaining the pathology and treatment options was given as well.  Questions were answered.  The patient expresses understanding & wishes to proceed with surgery.  -control GERD -VTE prophylaxis- SCDs, etc -mobilize as tolerated to help recovery    Adin Hector, M.D., F.A.C.S. Gastrointestinal and Minimally Invasive Surgery [facility] [address], [place], [place] [address] [phone] Main / Paging   12/17/2013  Note: Portions of this report may have been transcribed using voice recognition software. Every effort was made to ensure accuracy; however, inadvertent computerized transcription errors may be present.   Any transcriptional errors that result from this process are unintentional.

## 2013-12-17 NOTE — Progress Notes (Signed)
Consent for surgery signed and in patient's chart

## 2013-12-17 NOTE — ED Notes (Signed)
US at bedside

## 2013-12-18 ENCOUNTER — Encounter (HOSPITAL_COMMUNITY)
Admission: EM | Disposition: A | Discharge status: Home / Self Care | Payer: Self-pay | Source: Home / Self Care | Attending: Emergency Medicine

## 2013-12-18 ENCOUNTER — Inpatient Hospital Stay (HOSPITAL_COMMUNITY): Payer: Medicaid Other | Admitting: Anesthesiology

## 2013-12-18 ENCOUNTER — Encounter (HOSPITAL_COMMUNITY): Payer: Self-pay | Admitting: Anesthesiology

## 2013-12-18 ENCOUNTER — Inpatient Hospital Stay (HOSPITAL_COMMUNITY): Payer: Medicaid Other

## 2013-12-18 DIAGNOSIS — I10 Essential (primary) hypertension: Secondary | ICD-10-CM | POA: Diagnosis not present

## 2013-12-18 DIAGNOSIS — K851 Biliary acute pancreatitis without necrosis or infection: Secondary | ICD-10-CM | POA: Diagnosis present

## 2013-12-18 DIAGNOSIS — K8010 Calculus of gallbladder with chronic cholecystitis without obstruction: Secondary | ICD-10-CM | POA: Diagnosis not present

## 2013-12-18 DIAGNOSIS — R0602 Shortness of breath: Secondary | ICD-10-CM | POA: Diagnosis not present

## 2013-12-18 HISTORY — PX: CHOLECYSTECTOMY: SHX55

## 2013-12-18 LAB — GLUCOSE, CAPILLARY
GLUCOSE-CAPILLARY: 197 mg/dL — AB (ref 70–99)
Glucose-Capillary: 111 mg/dL — ABNORMAL HIGH (ref 70–99)
Glucose-Capillary: 121 mg/dL — ABNORMAL HIGH (ref 70–99)
Glucose-Capillary: 177 mg/dL — ABNORMAL HIGH (ref 70–99)

## 2013-12-18 SURGERY — LAPAROSCOPIC CHOLECYSTECTOMY WITH INTRAOPERATIVE CHOLANGIOGRAM
Anesthesia: General | Site: Abdomen

## 2013-12-18 MED ORDER — MORPHINE SULFATE 10 MG/ML IJ SOLN
INTRAMUSCULAR | Status: AC
  Administered 2013-12-18: 4 mg
  Filled 2013-12-18: qty 1

## 2013-12-18 MED ORDER — SCOPOLAMINE 1 MG/3DAYS TD PT72
1.0000 | MEDICATED_PATCH | Freq: Once | TRANSDERMAL | Status: DC
  Filled 2013-12-18: qty 1

## 2013-12-18 MED ORDER — HYDROMORPHONE HCL 1 MG/ML IJ SOLN
INTRAMUSCULAR | Status: DC | PRN
  Administered 2013-12-18: 0.5 mg via INTRAVENOUS

## 2013-12-18 MED ORDER — SCOPOLAMINE 1 MG/3DAYS TD PT72
MEDICATED_PATCH | TRANSDERMAL | Status: DC | PRN
  Administered 2013-12-18: 1 via TRANSDERMAL

## 2013-12-18 MED ORDER — ONDANSETRON HCL 4 MG/2ML IJ SOLN
INTRAMUSCULAR | Status: DC | PRN
  Administered 2013-12-18: 4 mg via INTRAVENOUS

## 2013-12-18 MED ORDER — HYDROMORPHONE HCL 1 MG/ML IJ SOLN
INTRAMUSCULAR | Status: AC
  Administered 2013-12-18: 11:00:00
  Filled 2013-12-18: qty 1

## 2013-12-18 MED ORDER — PROPOFOL 10 MG/ML IV BOLUS
INTRAVENOUS | Status: AC
  Filled 2013-12-18: qty 20

## 2013-12-18 MED ORDER — LIDOCAINE HCL (CARDIAC) 20 MG/ML IV SOLN
INTRAVENOUS | Status: DC | PRN
  Administered 2013-12-18: 50 mg via INTRAVENOUS

## 2013-12-18 MED ORDER — HYDROMORPHONE HCL 1 MG/ML IJ SOLN
1.0000 mg | INTRAMUSCULAR | Status: DC | PRN
  Administered 2013-12-18 – 2013-12-19 (×8): 1 mg via INTRAVENOUS
  Filled 2013-12-18 (×8): qty 1

## 2013-12-18 MED ORDER — MEPERIDINE HCL 25 MG/ML IJ SOLN: 6.2500 mg | INTRAMUSCULAR | Status: DC | PRN

## 2013-12-18 MED ORDER — IOHEXOL 300 MG/ML  SOLN
INTRAMUSCULAR | Status: DC | PRN
  Administered 2013-12-18: 10 mL

## 2013-12-18 MED ORDER — BUPIVACAINE-EPINEPHRINE 0.5% -1:200000 IJ SOLN
INTRAMUSCULAR | Status: DC | PRN
  Administered 2013-12-18: 20 mL

## 2013-12-18 MED ORDER — MIDAZOLAM HCL 5 MG/5ML IJ SOLN
INTRAMUSCULAR | Status: DC | PRN
  Administered 2013-12-18: 2 mg via INTRAVENOUS

## 2013-12-18 MED ORDER — 0.9 % SODIUM CHLORIDE (POUR BTL) OPTIME
TOPICAL | Status: DC | PRN
  Administered 2013-12-18: 1000 mL

## 2013-12-18 MED ORDER — METOCLOPRAMIDE HCL 5 MG/ML IJ SOLN: 10.0000 mg | Freq: Once | INTRAMUSCULAR | Status: DC | PRN

## 2013-12-18 MED ORDER — KCL IN DEXTROSE-NACL 20-5-0.45 MEQ/L-%-% IV SOLN
INTRAVENOUS | Status: AC
  Filled 2013-12-18: qty 1000

## 2013-12-18 MED ORDER — SUCCINYLCHOLINE CHLORIDE 20 MG/ML IJ SOLN
INTRAMUSCULAR | Status: DC | PRN
  Administered 2013-12-18: 100 mg via INTRAVENOUS

## 2013-12-18 MED ORDER — GLYCOPYRROLATE 0.2 MG/ML IJ SOLN
INTRAMUSCULAR | Status: DC | PRN
  Administered 2013-12-18: .6 mg via INTRAVENOUS

## 2013-12-18 MED ORDER — BUPIVACAINE-EPINEPHRINE (PF) 0.5% -1:200000 IJ SOLN
INTRAMUSCULAR | Status: AC
  Filled 2013-12-18: qty 30

## 2013-12-18 MED ORDER — MIDAZOLAM HCL 2 MG/2ML IJ SOLN
INTRAMUSCULAR | Status: AC
  Filled 2013-12-18: qty 2

## 2013-12-18 MED ORDER — ACETAMINOPHEN 325 MG PO TABS: 650.0000 mg | ORAL_TABLET | ORAL | Status: DC | PRN

## 2013-12-18 MED ORDER — SCOPOLAMINE 1 MG/3DAYS TD PT72
1.0000 | MEDICATED_PATCH | TRANSDERMAL | Status: DC
  Administered 2013-12-18: 1.5 mg via TRANSDERMAL

## 2013-12-18 MED ORDER — FENTANYL CITRATE 0.05 MG/ML IJ SOLN
INTRAMUSCULAR | Status: AC
  Filled 2013-12-18: qty 5

## 2013-12-18 MED ORDER — LACTATED RINGERS IV SOLN
INTRAVENOUS | Status: DC
Start: 2013-12-18 — End: 2013-12-19

## 2013-12-18 MED ORDER — DEXAMETHASONE SODIUM PHOSPHATE 10 MG/ML IJ SOLN
INTRAMUSCULAR | Status: AC
  Filled 2013-12-18: qty 1

## 2013-12-18 MED ORDER — HYDROMORPHONE HCL 1 MG/ML IJ SOLN
0.2500 mg | INTRAMUSCULAR | Status: DC | PRN
  Administered 2013-12-18 (×4): 0.5 mg via INTRAVENOUS

## 2013-12-18 MED ORDER — SCOPOLAMINE 1 MG/3DAYS TD PT72
MEDICATED_PATCH | TRANSDERMAL | Status: AC
  Filled 2013-12-18: qty 1

## 2013-12-18 MED ORDER — ESMOLOL HCL 10 MG/ML IV SOLN
INTRAVENOUS | Status: DC | PRN
  Administered 2013-12-18: 10 mg via INTRAVENOUS

## 2013-12-18 MED ORDER — BUPIVACAINE-EPINEPHRINE (PF) 0.25% -1:200000 IJ SOLN
INTRAMUSCULAR | Status: AC
  Filled 2013-12-18: qty 30

## 2013-12-18 MED ORDER — LACTATED RINGERS IV SOLN
INTRAVENOUS | Status: DC | PRN
  Administered 2013-12-18: 09:00:00 via INTRAVENOUS

## 2013-12-18 MED ORDER — PROPOFOL 10 MG/ML IV BOLUS
INTRAVENOUS | Status: DC | PRN
  Administered 2013-12-18: 150 mg via INTRAVENOUS

## 2013-12-18 MED ORDER — FENTANYL CITRATE 0.05 MG/ML IJ SOLN
INTRAMUSCULAR | Status: DC | PRN
  Administered 2013-12-18 (×3): 50 ug via INTRAVENOUS
  Administered 2013-12-18: 100 ug via INTRAVENOUS

## 2013-12-18 MED ORDER — DEXAMETHASONE SODIUM PHOSPHATE 10 MG/ML IJ SOLN
INTRAMUSCULAR | Status: DC | PRN
  Administered 2013-12-18: 10 mg via INTRAVENOUS

## 2013-12-18 MED ORDER — LIDOCAINE HCL (CARDIAC) 20 MG/ML IV SOLN
INTRAVENOUS | Status: AC
  Filled 2013-12-18: qty 5

## 2013-12-18 MED ORDER — KCL IN DEXTROSE-NACL 20-5-0.45 MEQ/L-%-% IV SOLN
INTRAVENOUS | Status: DC
  Administered 2013-12-18 (×2): via INTRAVENOUS
  Filled 2013-12-18 (×2): qty 1000

## 2013-12-18 MED ORDER — HYDROCODONE-ACETAMINOPHEN 5-325 MG PO TABS
1.0000 | ORAL_TABLET | ORAL | Status: DC | PRN
  Administered 2013-12-18 – 2013-12-19 (×3): 2 via ORAL
  Filled 2013-12-18 (×3): qty 2

## 2013-12-18 MED ORDER — HYDROMORPHONE HCL 1 MG/ML IJ SOLN
INTRAMUSCULAR | Status: AC
  Filled 2013-12-18: qty 1

## 2013-12-18 MED ORDER — NEOSTIGMINE METHYLSULFATE 10 MG/10ML IV SOLN
INTRAVENOUS | Status: DC | PRN
  Administered 2013-12-18: 4 mg via INTRAVENOUS

## 2013-12-18 MED ORDER — HYDROMORPHONE HCL 2 MG/ML IJ SOLN
INTRAMUSCULAR | Status: AC
  Filled 2013-12-18: qty 1

## 2013-12-18 MED ORDER — ROCURONIUM BROMIDE 100 MG/10ML IV SOLN
INTRAVENOUS | Status: DC | PRN
  Administered 2013-12-18: 30 mg via INTRAVENOUS
  Administered 2013-12-18: 10 mg via INTRAVENOUS

## 2013-12-18 MED ORDER — MORPHINE SULFATE 2 MG/ML IJ SOLN
2.0000 mg | Freq: Once | INTRAMUSCULAR | Status: AC
  Administered 2013-12-18: 4 mg via INTRAVENOUS

## 2013-12-18 MED ORDER — ONDANSETRON HCL 4 MG/2ML IJ SOLN
INTRAMUSCULAR | Status: AC
  Filled 2013-12-18: qty 2

## 2013-12-18 MED ORDER — LACTATED RINGERS IR SOLN
Status: DC | PRN
  Administered 2013-12-18: 1

## 2013-12-18 SURGICAL SUPPLY — 38 items
APPLIER CLIP ROT 10 11.4 M/L (STAPLE) ×3
BENZOIN TINCTURE PRP APPL 2/3 (GAUZE/BANDAGES/DRESSINGS) ×3 IMPLANT
CABLE HIGH FREQUENCY MONO STRZ (ELECTRODE) ×3 IMPLANT
CANISTER SUCTION 2500CC (MISCELLANEOUS) ×3 IMPLANT
CHLORAPREP W/TINT 26ML (MISCELLANEOUS) ×3 IMPLANT
CLIP APPLIE ROT 10 11.4 M/L (STAPLE) ×1 IMPLANT
CLOSURE STERI-STRIP 1/4X4 (GAUZE/BANDAGES/DRESSINGS) ×3 IMPLANT
CLOSURE WOUND 1/2 X4 (GAUZE/BANDAGES/DRESSINGS) ×1
COVER MAYO STAND STRL (DRAPES) ×3 IMPLANT
DECANTER SPIKE VIAL GLASS SM (MISCELLANEOUS) ×3 IMPLANT
DRAPE C-ARM 42X120 X-RAY (DRAPES) ×3 IMPLANT
DRAPE LAPAROSCOPIC ABDOMINAL (DRAPES) ×3 IMPLANT
DRAPE UTILITY XL STRL (DRAPES) ×3 IMPLANT
ELECT REM PT RETURN 9FT ADLT (ELECTROSURGICAL) ×3
ELECTRODE REM PT RTRN 9FT ADLT (ELECTROSURGICAL) ×1 IMPLANT
GAUZE SPONGE 2X2 8PLY STRL LF (GAUZE/BANDAGES/DRESSINGS) ×1 IMPLANT
GLOVE SURG ORTHO 8.0 STRL STRW (GLOVE) ×3 IMPLANT
GOWN STRL REUS W/TWL XL LVL3 (GOWN DISPOSABLE) ×6 IMPLANT
HEMOSTAT SURGICEL 4X8 (HEMOSTASIS) IMPLANT
KIT BASIN OR (CUSTOM PROCEDURE TRAY) ×3 IMPLANT
NS IRRIG 1000ML POUR BTL (IV SOLUTION) ×3 IMPLANT
POUCH SPECIMEN RETRIEVAL 10MM (ENDOMECHANICALS) ×3 IMPLANT
SCISSORS LAP 5X35 DISP (ENDOMECHANICALS) ×3 IMPLANT
SET CHOLANGIOGRAPH MIX (MISCELLANEOUS) ×3 IMPLANT
SET IRRIG TUBING LAPAROSCOPIC (IRRIGATION / IRRIGATOR) ×3 IMPLANT
SLEEVE XCEL OPT CAN 5 100 (ENDOMECHANICALS) ×3 IMPLANT
SOLUTION ANTI FOG 6CC (MISCELLANEOUS) ×3 IMPLANT
SPONGE GAUZE 2X2 STER 10/PKG (GAUZE/BANDAGES/DRESSINGS) ×2
STRIP CLOSURE SKIN 1/2X4 (GAUZE/BANDAGES/DRESSINGS) ×2 IMPLANT
SUT MNCRL AB 4-0 PS2 18 (SUTURE) ×3 IMPLANT
TAPE CLOTH SURG 4X10 WHT LF (GAUZE/BANDAGES/DRESSINGS) ×3 IMPLANT
TOWEL OR 17X26 10 PK STRL BLUE (TOWEL DISPOSABLE) ×3 IMPLANT
TOWEL OR NON WOVEN STRL DISP B (DISPOSABLE) ×3 IMPLANT
TRAY LAPAROSCOPIC (CUSTOM PROCEDURE TRAY) ×3 IMPLANT
TROCAR BLADELESS OPT 5 100 (ENDOMECHANICALS) ×3 IMPLANT
TROCAR XCEL BLUNT TIP 100MML (ENDOMECHANICALS) ×3 IMPLANT
TROCAR XCEL NON-BLD 11X100MML (ENDOMECHANICALS) ×3 IMPLANT
TUBING INSUFFLATION 10FT LAP (TUBING) ×3 IMPLANT

## 2013-12-18 NOTE — Progress Notes (Addendum)
Patient ID: Ashley Owens, female   DOB: Jun 05, 1975, 38 y.o.   MRN: [id number]  [facility] PROGRESS NOTE  Ashley Owens [id number] DOB: Jun 05, 1975 DOA: 12/16/2013 PCP: No primary care provider on file.  Brief narrative: Pt is 38 yo female admitted with N/V, abd pain, now with new diagnosis of biliary pancreatitis, US with multiple gallstones. Pt admitted for planned lap chole with IOC.   Assessment and Plan:    Principal Problem:   Biliary pancreatitis  - status post lac chole with IOC, 11/21 - pt stable post op - provide analgesia as needed, advance diet as pt able to tolerate  - lipase is WNL this AM Active Problems:   HTN - post op likely - continue to monitor for now, no need for antihypertensive regimen    Transaminitis - from the principal problem   DVT prophylaxis  SCD  Code Status: Full Family Communication: Pt at bedside Disposition Plan: Home when medically stable   IV Access:   Peripheral IV Procedures and diagnostic studies:   Dg Cholangiogram Operative  12/18/2013  Unremarkable intraoperative cholangiogram.    [place] Abdomen Complete  12/17/2013   Cholelithiasis and mild gallbladder distention without sonographic findings of acute cholecystitis.  Hepatic steatosis.   Dg Chest Port 1 View  12/17/2013 No acute cardiopulmonary abnormality seen.   Medical Consultants:   Surgery  Other Consultants:   None  Anti-Infectives:   Rocephin 2 gm IV  Debbora PrestoMAGICK-MYERS, ISKRA, MD  [facility] Pager [phone]  If 7PM-7AM, please contact night-coverage [email or web address] Password [email or web address] 12/18/2013, 10:59 AM   LOS: 2 days   HPI/Subjective: No events overnight.   Objective: Filed Vitals:   12/18/13 1040 12/18/13 1045 12/18/13 1050 12/18/13 1055  BP:  154/79    Pulse: 78 90 80 81  Temp:      TempSrc:      Resp: 19 18 13 19   Height:      Weight:      SpO2: 100% 100% 100% 100%    Intake/Output Summary (Last 24 hours) at 12/18/13 1059 Last data filed at 12/18/13 1030  Gross per 24 hour  Intake   2790 ml  Output   1675 ml  Net   1115 ml    Exam:   General:  Pt is alert, follows commands appropriately, not in acute distress  Cardiovascular: Regular rate and rhythm, S1/S2, no murmurs, no rubs, no gallops  Respiratory: Clear to auscultation bilaterally, no wheezing, no crackles, no rhonchi  Abdomen: non distended, no guarding   Data Reviewed: Basic Metabolic Panel:  Recent Labs Lab 12/16/13 2226 12/17/13 0535  NA 142 138  K 4.1 3.9  CL 101 100  CO2 28 26  GLUCOSE 98 108*  BUN 17 14  CREATININE 0.66 0.58  CALCIUM 9.7 9.3   Liver Function Tests:  Recent Labs Lab 12/16/13 2226 12/17/13 0535  AST 153* 138*  ALT 122* 144*  ALKPHOS 85 86  BILITOT 0.3 0.4  PROT 7.4 6.9  ALBUMIN 3.6 3.3*    Recent Labs Lab 12/16/13 2226 12/17/13 0535  LIPASE 135* 44   No results for input(s): AMMONIA in the last 168 hours. CBC:  Recent Labs Lab 12/16/13 2226 12/17/13 0535  WBC 9.9 6.6  NEUTROABS 6.5 3.9  HGB 14.1 12.9  HCT 41.8 38.9  MCV 91.1 91.3  PLT 199 187   CBG:  Recent Labs Lab 12/17/13 0532 12/17/13 1144 12/17/13 1804 12/17/13 2347 12/18/13 0609  GLUCAP 114* 96 179* 108* 111*    Recent  Results (from the past 240 hour(s))  Surgical pcr screen     Status: None   Collection Time: 12/17/13  5:23 PM  Result Value Ref Range Status   MRSA, PCR NEGATIVE NEGATIVE Final   Staphylococcus aureus NEGATIVE NEGATIVE Final    Comment:        The Xpert SA Assay (FDA approved for NASAL specimens in patients over 40 years of age), is one component of a comprehensive surveillance program.  Test performance has been validated by [facility] for patients greater than or equal to 40 year old. It is not intended to diagnose infection nor to guide or monitor treatment.      Scheduled Meds: . cefTRIAXone (ROCEPHIN)  IV  2 g Intravenous Q24H  . dextrose 5 % and 0.45 % NaCl with KCl 20 mEq/L      . HYDROmorphone      .  HYDROmorphone      . Influenza vac split quadrivalent PF  0.5 mL Intramuscular Tomorrow-1000  . lip balm  1 application Topical BID  . polyethylene glycol  17 g Oral BID  . saccharomyces boulardii  250 mg Oral BID   Continuous Infusions: . dextrose 5 % and 0.45 % NaCl with KCl 20 mEq/L

## 2013-12-18 NOTE — Anesthesia Preprocedure Evaluation (Addendum)
Anesthesia Evaluation  Patient identified by MRN, date of birth, ID band Patient awake    Reviewed: Allergy & Precautions, H&P , NPO status , Patient's Chart, lab work & pertinent test results, reviewed documented beta blocker date and time   Airway Mallampati: III  TM Distance: >3 FB Neck ROM: Full    Dental no notable dental hx. (+) Teeth Intact   Pulmonary neg pulmonary ROS,  breath sounds clear to auscultation  Pulmonary exam normal       Cardiovascular negative cardio ROS  Rhythm:Regular Rate:Normal     Neuro/Psych negative neurological ROS  negative psych ROS   GI/Hepatic (+) Hepatitis -Non alcoholic hepatic steatosis Acute pancreatitis due to gallstones Acute on chronic cholecystitis   Endo/Other  negative endocrine ROS  Renal/GU negative Renal ROS  negative genitourinary   Musculoskeletal negative musculoskeletal ROS (+)   Abdominal (+)  Abdomen: soft and tender.    Peds  Hematology negative hematology ROS (+)   Anesthesia Other Findings   Reproductive/Obstetrics negative OB ROS                            Anesthesia Physical Anesthesia Plan  ASA: II and emergent  Anesthesia Plan: General   Post-op Pain Management:    Induction: Intravenous, Rapid sequence and Cricoid pressure planned  Airway Management Planned: Oral ETT  Additional Equipment:   Intra-op Plan:   Post-operative Plan: Extubation in OR  Informed Consent: I have reviewed the patients History and Physical, chart, labs and discussed the procedure including the risks, benefits and alternatives for the proposed anesthesia with the patient or authorized representative who has indicated his/her understanding and acceptance.   Dental advisory given  Plan Discussed with: CRNA, Anesthesiologist and Surgeon  Anesthesia Plan Comments:         Anesthesia Quick Evaluation

## 2013-12-18 NOTE — Progress Notes (Signed)
Patient ID: Ashley Owens, female   DOB: 1975/10/11, 38 y.o.   MRN: [id number]  [facility]  Subjective: Patient comfortable.  Family at bedside to translate.  Denies abdominal pain.  Objective: Vital signs in last 24 hours: Temp:  [98 F (36.7 C)-98.5 F (36.9 C)] 98.5 F (36.9 C) (11/21 40980613) Pulse Rate:  [64-75] 75 (11/21 0613) Resp:  [16-18] 18 (11/21 11910613) BP: (98-124)/(59-74) 106/64 mmHg (11/21 0613) SpO2:  [98 %-100 %] 98 % (11/21 0613) Last BM Date: 12/16/13  Intake/Output from previous day: 11/20 0701 - 11/21 0700 In: 2050 [P.O.:450; I.V.:1600] Out: 1675 [Urine:1675]  Exam: HEENT - clear, not icteric Neck - soft Chest - clear bilaterally Cor - RRR, no murmur Abd - soft without distension; no tenderness Ext - no significant edema Neuro - grossly intact, no focal deficits  Lab Results:   Recent Labs  12/16/13 2226 12/17/13 0535  WBC 9.9 6.6  HGB 14.1 12.9  HCT 41.8 38.9  PLT 199 187     Recent Labs  12/16/13 2226 12/17/13 0535  NA 142 138  K 4.1 3.9  CL 101 100  CO2 28 26  GLUCOSE 98 108*  BUN 17 14  CREATININE 0.66 0.58  CALCIUM 9.7 9.3    Studies/Results: [place] Abdomen Complete  12/17/2013   CLINICAL DATA:  Recurrent sharp mid epigastric pain, bloating, no vomiting.  EXAM: ULTRASOUND ABDOMEN COMPLETE  COMPARISON:  None.  FINDINGS: Gallbladder: Multiple echogenic gallstones, difficult to discretely measure, demonstrating acoustic shadowing. Mild gallbladder distention with bowel wall thickening or pericholecystic fluid. No sonographic Murphy's sign elicited.  Common bile duct: Diameter: 5 mm  Liver: Diffusely echogenic without intrahepatic biliary dilatation. Hepatopetal portal vein.  IVC: No abnormality visualized.  Pancreas: Obscured, likely by bowel gas.  Spleen: Size and appearance within normal limits.  Right Kidney: Length: 9.6 cm. Echogenicity within normal limits. No mass or hydronephrosis  visualized.  Left Kidney: Length: 10.5 cm. Echogenicity within normal limits. No mass or hydronephrosis visualized.  Abdominal aorta: Obscured, likely by bowel gas.  Other findings: None.  IMPRESSION: Cholelithiasis and mild gallbladder distention without sonographic findings of acute cholecystitis.  Hepatic steatosis.   Electronically Signed   By: Awilda Metroourtnay  Bloomer   On: 12/17/2013 01:06   Dg Chest Port 1 View  12/17/2013   CLINICAL DATA:  Preop chest exam for cholecystectomy.  EXAM: PORTABLE CHEST - 1 VIEW  COMPARISON:  None.  FINDINGS: The heart size and mediastinal contours are within normal limits. Both lungs are clear. The visualized skeletal structures are unremarkable.  IMPRESSION: No acute cardiopulmonary abnormality seen.   Electronically Signed   By: Roque LiasJames  Green M.D.   On: 12/17/2013 10:48    Assessment / Plan: 1.  Biliary pancreatitis, cholelithiasis  Plan lap chole with IOC this AM  The risks and benefits of the procedure have been discussed at length with the patient.  The patient understands the proposed procedure, potential alternative treatments, and the course of recovery to be expected.  All of the patient's questions have been answered at this time.  The patient wishes to proceed with surgery.  Velora Hecklerodd M. Bee Marchiano, MD, [facility] Office: [phone]  12/18/2013

## 2013-12-18 NOTE — Op Note (Signed)
Procedure Note  Pre-operative Diagnosis:  Cholelithiasis, biliary pancreatitis  Post-operative Diagnosis:  same  Surgeon:  Velora Hecklerodd M. Sammie Denner, MD, FACS  Assistant:  Jaclynn GuarneriBen Hoxworth, MD, FACS   Procedure:  Laparoscopic cholecystectomy with intra-operative cholangiography  Anesthesia:  General  Estimated Blood Loss:  minimal  Drains: none         Specimen: Gallbladder to pathology  Indications:  38 yo female admitted with biliary pancreatitis.  Pancreatic enzymes now normal.  Patient is pain free.  USN shows multiple gallstones.  For lap chole with IOC.  Procedure Details:  The patient was seen in the pre-op holding area. The risks, benefits, complications, treatment options, and expected outcomes were previously discussed with the patient. The patient agreed with the proposed plan and has signed the informed consent form.  The patient was brought to the Operating Room, identified as Lorita OfficerJuana Leon and the procedure verified as laparoscopic cholecystectomy with intraoperative cholangiography. A "time out" was completed and the above information confirmed.  The patient was placed in the supine position. Following induction of general anesthesia, the abdomen was prepped and draped in the usual aseptic fashion.  An incision was made in the skin near the umbilicus. The midline fascia was incised and the peritoneal cavity was entered and a Hasson canula was introduced under direct vision.  The Hasson canula was secured with a 0-Vicryl pursestring suture. Pneumoperitoneum was established with carbon dioxide. Additional trocars were introduced under direct vision along the right costal margin in the midline, mid-clavicular line, and anterior axillary line.   The gallbladder was identified.  It was aspirated.  It contained "milk bile" consistent with cystic duct obstruction. The fundus was grasped and retracted cephalad. Adhesions were taken down bluntly and the electrocautery was utilized as needed, taking  care not to injure any adjacent structures. The infundibulum was grasped and retracted laterally, exposing the peritoneum overlying the triangle of Calot. There was a large gallstone impacted in the neck of the gallbladder which could not be dislodged.  The peritoneum was incised and structures exposed with blunt dissection. The cystic duct was clearly identified, bluntly dissected circumferentially, and clipped at the neck of the gallbladder.  An incision was made in the cystic duct and the cholangiogram catheter introduced. The catheter was secured using an ligaclip.  Real-time cholangiography was performed using C-arm fluoroscopy.  There was rapid filling of a normal caliber common bile duct.  There was reflux of contrast into the left and right hepatic ductal systems.  There was free flow distally into the duodenum without filling defect or obstruction.  The catheter was removed from the peritoneal cavity.  The cystic duct was then ligated with surgical clips and divided. The cystic artery was identified, dissected circumferentially, ligated with ligaclips, and divided.  The gallbladder was dissected away from the liver bed using the electrocautery for hemostasis. The gallbladder was completely removed from the liver and placed into an endocatch bag. The gallbladder was removed in the endocatch bag through the umbilical port site and submitted to pathology for review.  The right upper quadrant was irrigated and the gallbladder bed was inspected. Hemostasis was achieved with the electrocautery.  Pneumoperitoneum was released after viewing removal of the trocars with good hemostasis noted. The umbilical wound was irrigated and the fascia was then closed with the pursestring suture.  Local anesthetic was infiltrated at all port sites. The skin incisions were closed with 4-0 Monocril subcuticular sutures and steri-strips and dressings were applied.  Instrument, sponge, and needle counts  were correct at  the conclusion of the case.  The patient was awakened from anesthesia and brought to the recovery room in stable condition.  The patient tolerated the procedure well.   Velora Hecklerodd M. Emmajane Altamura, MD, [facility] Office: [phone]

## 2013-12-18 NOTE — Plan of Care (Signed)
Problem: Phase I Progression Outcomes Goal: Pain controlled with appropriate interventions Outcome: Completed/Met Date Met:  12/18/13 Goal: Incision/dressings dry and intact Outcome: Completed/Met Date Met:  12/18/13 Goal: Initial discharge plan identified Outcome: Completed/Met Date Met:  12/18/13 Goal: Vital signs/hemodynamically stable Outcome: Completed/Met Date Met:  12/18/13

## 2013-12-18 NOTE — Anesthesia Postprocedure Evaluation (Signed)
  Anesthesia Post-op Note  Patient: Ashley Owens  Procedure(s) Performed: Procedure(s): LAPAROSCOPIC CHOLECYSTECTOMY WITH INTRAOPERATIVE CHOLANGIOGRAM (N/A)  Patient Location: PACU  Anesthesia Type:General  Level of Consciousness: awake, oriented and sedated  Airway and Oxygen Therapy: Patient Spontanous Breathing and Patient connected to nasal cannula oxygen  Post-op Pain: mild  Post-op Assessment: Post-op Vital signs reviewed, Patient's Cardiovascular Status Stable, Respiratory Function Stable, Patent Airway, No signs of Nausea or vomiting and Pain level controlled  Post-op Vital Signs: Reviewed and stable  Last Vitals:  Filed Vitals:   12/18/13 1138  BP:   Pulse:   Temp: 37 C  Resp:     Complications: No apparent anesthesia complications

## 2013-12-18 NOTE — Transfer of Care (Signed)
Immediate Anesthesia Transfer of Care Note  Patient: Ashley OfficerJuana Owens  Procedure(s) Performed: Procedure(s) (LRB): LAPAROSCOPIC CHOLECYSTECTOMY WITH INTRAOPERATIVE CHOLANGIOGRAM (N/A)  Patient Location: PACU  Anesthesia Type: General  Level of Consciousness: sedated, patient cooperative and responds to stimulation  Airway & Oxygen Therapy: Patient Spontanous Breathing and Patient connected to face mask oxgen  Post-op Assessment: Report given to PACU RN and Post -op Vital signs reviewed and stable  Post vital signs: Reviewed and stable  Complications: No apparent anesthesia complications

## 2013-12-18 NOTE — Progress Notes (Signed)
Utilization Review completed.  

## 2013-12-19 LAB — CBC
HCT: 38.3 % (ref 36.0–46.0)
Hemoglobin: 12.7 g/dL (ref 12.0–15.0)
MCH: 30.2 pg (ref 26.0–34.0)
MCHC: 33.2 g/dL (ref 30.0–36.0)
MCV: 91 fL (ref 78.0–100.0)
PLATELETS: 217 10*3/uL (ref 150–400)
RBC: 4.21 MIL/uL (ref 3.87–5.11)
RDW: 12.2 % (ref 11.5–15.5)
WBC: 10.3 10*3/uL (ref 4.0–10.5)

## 2013-12-19 LAB — BASIC METABOLIC PANEL
ANION GAP: 12 (ref 5–15)
BUN: 7 mg/dL (ref 6–23)
CALCIUM: 8.9 mg/dL (ref 8.4–10.5)
CHLORIDE: 102 meq/L (ref 96–112)
CO2: 23 mEq/L (ref 19–32)
CREATININE: 0.55 mg/dL (ref 0.50–1.10)
GFR calc non Af Amer: >90 mL/min (ref >90)
Glucose, Bld: 168 mg/dL — ABNORMAL HIGH (ref 70–99)
Potassium: 4 mEq/L (ref 3.7–5.3)
SODIUM: 137 meq/L (ref 137–147)

## 2013-12-19 LAB — GLUCOSE, CAPILLARY
GLUCOSE-CAPILLARY: 152 mg/dL — AB (ref 70–99)
Glucose-Capillary: 167 mg/dL — ABNORMAL HIGH (ref 70–99)

## 2013-12-19 MED ORDER — POLYETHYLENE GLYCOL 3350 17 G PO PACK
17.0000 g | PACK | Freq: Once | ORAL | Status: AC
Start: 2013-12-19 — End: 2013-12-19
  Administered 2013-12-19: 17 g via ORAL
  Filled 2013-12-19: qty 1

## 2013-12-19 MED ORDER — HYDROCODONE-ACETAMINOPHEN 5-325 MG PO TABS: 1.0000 | ORAL_TABLET | ORAL | Status: AC | PRN

## 2013-12-19 MED ORDER — ONDANSETRON HCL 4 MG PO TABS: 4.0000 mg | ORAL_TABLET | Freq: Four times a day (QID) | ORAL | Status: AC | PRN

## 2013-12-19 NOTE — Progress Notes (Signed)
Motivation much improved this AM.  Pt walked, slowly and with 2 assists this AM for 60 feet.  Is voiding well and tolerating clears to full.

## 2013-12-19 NOTE — Progress Notes (Signed)
UR completed 

## 2013-12-19 NOTE — Progress Notes (Signed)
Patient ID: Ashley OfficerJuana Owens, female   DOB: Sep 12, 1975, 38 y.o.   MRN: [id number] 1 Day Post-Op  Subjective: Just sore, no other C/O  Objective: Vital signs in last 24 hours: Temp:  [97.8 F (36.6 C)-98.8 F (37.1 C)] 98.1 F (36.7 C) (11/22 0531) Pulse Rate:  [61-90] 61 (11/22 0531) Resp:  [12-20] 20 (11/22 0531) BP: (100-156)/(48-82) 110/67 mmHg (11/22 0531) SpO2:  [95 %-100 %] 95 % (11/22 0531) Last BM Date: 12/16/13  Intake/Output from previous day: 11/21 0701 - 11/22 0700 In: 2470 [P.O.:720; I.V.:1750] Out: 1700 [Urine:1700] Intake/Output this shift:    General appearance: alert, cooperative and no distress GI: normal findings: soft, non-tender Incision/Wound: Clean and dry  Lab Results:   Recent Labs  12/17/13 0535 12/19/13 0349  WBC 6.6 10.3  HGB 12.9 12.7  HCT 38.9 38.3  PLT 187 217   BMET  Recent Labs  12/17/13 0535 12/19/13 0349  NA 138 137  K 3.9 4.0  CL 100 102  CO2 26 23  GLUCOSE 108* 168*  BUN 14 7  CREATININE 0.58 0.55  CALCIUM 9.3 8.9     Studies/Results: Dg Cholangiogram Operative  12/18/2013   CLINICAL DATA:  Cholecystectomy for cholelithiasis and biliary pancreatitis.  EXAM: INTRAOPERATIVE CHOLANGIOGRAM  TECHNIQUE: Cholangiographic images from the C-arm fluoroscopic device were submitted for interpretation post-operatively. Please see the procedural report for the amount of contrast and the fluoroscopy time utilized.  COMPARISON:  Ultrasound on 12/17/2013  FINDINGS: Intraoperative cholangiogram obtained with a C-arm demonstrates no evidence of biliary filling defects. The common bile duct is top normal in caliber. Contrast enters the duodenum normally. No contrast extravasation identified.  IMPRESSION: Unremarkable intraoperative cholangiogram.   Electronically Signed   By: Irish LackGlenn  Yamagata M.D.   On: 12/18/2013 09:58   Dg Chest Port 1 View  12/17/2013   CLINICAL DATA:  Preop chest exam for cholecystectomy.  EXAM: PORTABLE CHEST - 1 VIEW   COMPARISON:  None.  FINDINGS: The heart size and mediastinal contours are within normal limits. Both lungs are clear. The visualized skeletal structures are unremarkable.  IMPRESSION: No acute cardiopulmonary abnormality seen.   Electronically Signed   By: Roque LiasJames  Green M.D.   On: 12/17/2013 10:48    Anti-infectives: Anti-infectives    Start     Dose/Rate Route Frequency Ordered Stop   12/17/13 1200  piperacillin-tazobactam (ZOSYN) IVPB 3.375 g  Status:  Discontinued     3.375 g12.5 mL/hr over 240 Minutes Intravenous Every 8 hours 12/17/13 0456 12/17/13 0714   12/17/13 0800  cefTRIAXone (ROCEPHIN) 2 g in dextrose 5 % 50 mL IVPB - Premix  Status:  Discontinued    Comments:  Pharmacy may adjust dosing strength / duration / interval for maximal efficacy   2 g100 mL/hr over 30 Minutes Intravenous Every 24 hours 12/17/13 0714 12/18/13 1203   12/17/13 0330  piperacillin-tazobactam (ZOSYN) IVPB 3.375 g  Status:  Discontinued     3.375 g12.5 mL/hr over 240 Minutes Intravenous  Once 12/17/13 0319 12/17/13 0714      Assessment/Plan: s/p Procedure(s): LAPAROSCOPIC CHOLECYSTECTOMY WITH INTRAOPERATIVE CHOLANGIOGRAM Doing well post op without complications OK for discharge today   LOS: 3 days    Ashley Owens T 12/19/2013

## 2013-12-19 NOTE — Discharge Summary (Signed)
Physician Discharge Summary  Lorita OfficerJuana Leon OZH:[id number] DOB: 1975/04/02 DOA: 12/16/2013  PCP: No primary care provider on file.  Admit date: 12/16/2013 Discharge date: 12/19/2013  Recommendations for Outpatient Follow-up:  1. Pt will need to follow up with PCP in 2-3 weeks post discharge  Discharge Diagnoses:  Principal Problem:   Gallstone pancreatitis Active Problems:   Acute cholecystitis with chronic cholecystitis   Heartburn   Steatohepatitis, nonalcoholic   Pancreatitis, gallstone   Discharge Condition: Stable  Diet recommendation: Heart healthy diet discussed in details    Brief narrative: Pt is 38 yo female admitted with N/V, abd pain, now with new diagnosis of biliary pancreatitis, US with multiple gallstones. Pt admitted for planned lap chole with IOC.   Assessment and Plan:    Principal Problem:  Biliary pancreatitis  - status post lac chole with IOC, 11/21 - pt stable post op and tolerating current diet well  - provide analgesia as needed, advance diet as pt able to tolerate  - lipase is WNL this AM Active Problems:  HTN - reasonable inpatient control   Transaminitis - from the principal problem   DVT prophylaxis  SCD  Code Status: Full Family Communication: Pt at bedside Disposition Plan: Home   IV Access:    Peripheral IV Procedures and diagnostic studies:    Dg Cholangiogram Operative 12/18/2013 Unremarkable intraoperative cholangiogram.   [place] Abdomen Complete 12/17/2013 Cholelithiasis and mild gallbladder distention without sonographic findings of acute cholecystitis. Hepatic steatosis.   Dg Chest Port 1 View 12/17/2013 No acute cardiopulmonary abnormality seen.  Medical Consultants:    Surgery  Other Consultants:    None  Anti-Infectives:    Rocephin 2 gm IV   Discharge Exam: Filed Vitals:   12/19/13 1024  BP: 111/54  Pulse: 60  Temp: 98.2 F (36.8 C)  Resp: 20   Filed Vitals:   12/19/13  0001 12/19/13 0200 12/19/13 0531 12/19/13 1024  BP: 120/60 100/52 110/67 111/54  Pulse:  71 61 60  Temp:  97.8 F (36.6 C) 98.1 F (36.7 C) 98.2 F (36.8 C)  TempSrc:  Oral Oral Oral  Resp: 20 20 20 20   Height:      Weight:      SpO2: 100% 95% 95% 97%    General: Pt is alert, follows commands appropriately, not in acute distress Cardiovascular: Regular rate and rhythm, S1/S2 +, no murmurs, no rubs, no gallops Respiratory: Clear to auscultation bilaterally, no wheezing, no crackles, no rhonchi Abdominal: Soft, non tender, non distended, bowel sounds +, no guarding Extremities: no edema, no cyanosis, pulses palpable bilaterally DP and PT Neuro: Grossly nonfocal  Discharge Instructions  Discharge Instructions    Diet - low sodium heart healthy    Complete by:  As directed      Increase activity slowly    Complete by:  As directed             Medication List    TAKE these medications        HYDROcodone-acetaminophen 5-325 MG per tablet  Commonly known as:  NORCO/VICODIN  Take 1-2 tablets by mouth every 4 (four) hours as needed for moderate pain.     ibuprofen 200 MG tablet  Commonly known as:  ADVIL,MOTRIN  Take 200 mg by mouth every 6 (six) hours as needed for moderate pain.     ondansetron 4 MG tablet  Commonly known as:  ZOFRAN  Take 1 tablet (4 mg total) by mouth every 6 (six) hours as needed for nausea.  Follow-up Information    [facility] [place] [facility]   Why:  Your appointment is at 3 PM, be at the office at 2:30 PM for check in there is a long process.  It would be helpful to have someone who can interpret for you.   Contact information:   [address]   [place] [address] [phone]       Follow up with Debbora PrestoMAGICK-Jenisis Harmsen, MD.   Specialty:  Internal Medicine   Why:  As needed, If symptoms worsen   Contact information:   [address] [place] [address] [phone]         The results of significant diagnostics from this hospitalization (including imaging, microbiology, ancillary and laboratory) are listed below for reference.     Microbiology: Recent Results (from the past 240 hour(s))  Surgical pcr screen     Status: None   Collection Time: 12/17/13  5:23 PM  Result Value Ref Range Status   MRSA, PCR NEGATIVE NEGATIVE Final   Staphylococcus aureus NEGATIVE NEGATIVE Final    Comment:        The Xpert SA Assay (FDA approved for NASAL specimens in patients over 40 years of age), is one component of a comprehensive surveillance program.  Test performance has been validated by [facility] for patients greater than or equal to 40 year old. It is not intended to diagnose infection nor to guide or monitor treatment.      Labs: Basic Metabolic Panel:  Recent Labs Lab 12/16/13 2226 12/17/13 0535 12/19/13 0349  NA 142 138 137  K 4.1 3.9 4.0  CL 101 100 102  CO2 28 26 23   GLUCOSE 98 108* 168*  BUN 17 14 7   CREATININE 0.66 0.58 0.55  CALCIUM 9.7 9.3 8.9   Liver Function Tests:  Recent Labs Lab 12/16/13 2226 12/17/13 0535  AST 153* 138*  ALT 122* 144*  ALKPHOS 85 86  BILITOT 0.3 0.4  PROT 7.4 6.9  ALBUMIN 3.6 3.3*    Recent Labs Lab 12/16/13 2226 12/17/13 0535  LIPASE 135* 44   No results for input(s): AMMONIA in the last 168 hours. CBC:  Recent Labs Lab 12/16/13 2226 12/17/13 0535 12/19/13 0349  WBC 9.9 6.6 10.3  NEUTROABS 6.5 3.9  --   HGB 14.1 12.9 12.7  HCT 41.8 38.9 38.3  MCV 91.1 91.3 91.0  PLT 199 187 217   Cardiac Enzymes: No results for input(s): CKTOTAL, CKMB, CKMBINDEX, TROPONINI in the last 168 hours. BNP: BNP (last 3 results) No results for input(s): PROBNP in the last 8760 hours. CBG:  Recent Labs Lab 12/18/13 0609 12/18/13 1153 12/18/13 1931 12/18/13 2323 12/19/13 0606  GLUCAP 111* 121* 197* 177* 167*     SIGNED: Time coordinating discharge: Over 30 minutes  MAGICK-Sydne Krahl,  Bleu Minerd, MD  [facility] 12/19/2013, 10:27 AM Pager [phone]  If 7PM-7AM, please contact night-coverage [email or web address] Password [email or web address]

## 2013-12-19 NOTE — Progress Notes (Signed)
Pt refusing to walk the first 4 hours of the shift c/o of pain.  Pain addressed and with much encouragement and 2 assists pt did walk 30 feet at midnight.

## 2013-12-20 ENCOUNTER — Encounter (HOSPITAL_COMMUNITY): Payer: Self-pay | Admitting: Surgery

## 2015-10-08 IMAGING — US US ABDOMEN COMPLETE
1 series · 14 of 25 positions shown · non-contrast
Comparison: None.

CLINICAL DATA: Recurrent sharp mid epigastric pain, bloating, no
vomiting.

EXAM:
ULTRASOUND ABDOMEN COMPLETE

[Series 1: us abdomen complete · 0.27mm/px · 14 of 64 slices shown]
[im 1/64]
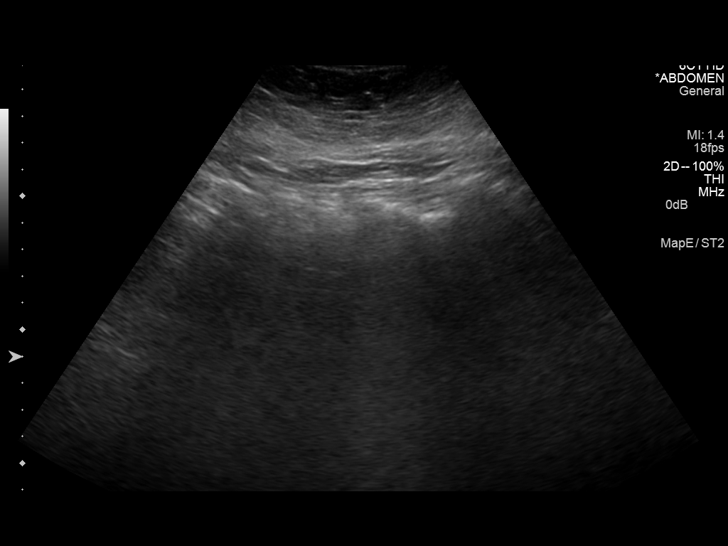
[im 6/64]
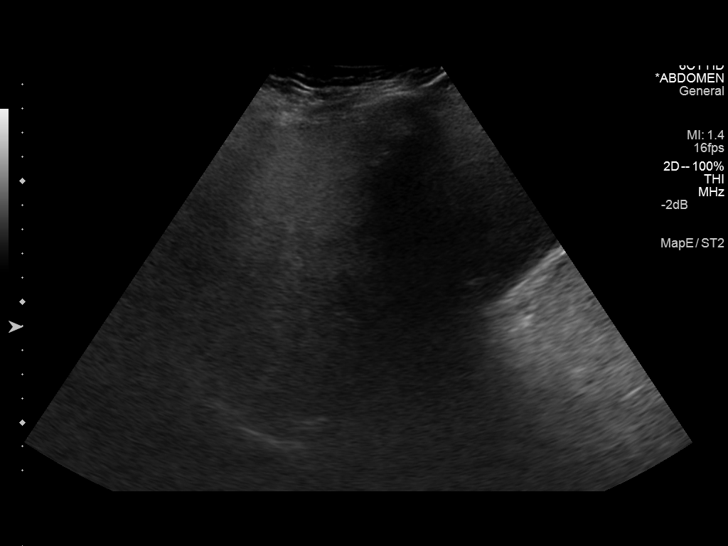
[im 11/64]
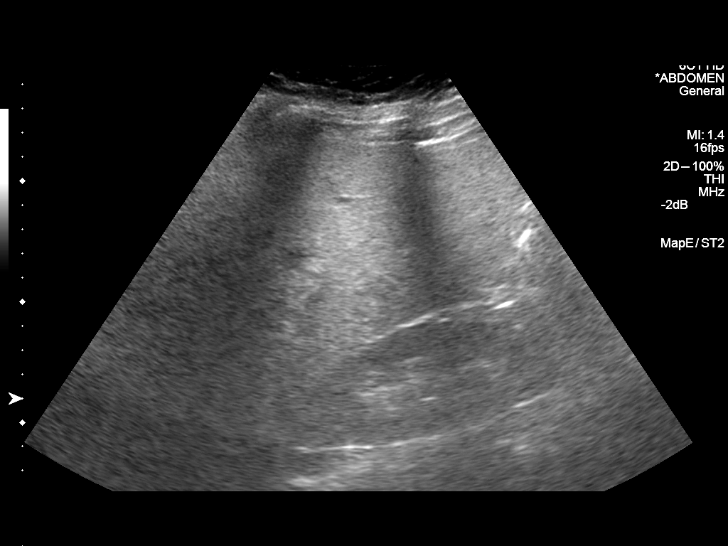
[im 16/64]
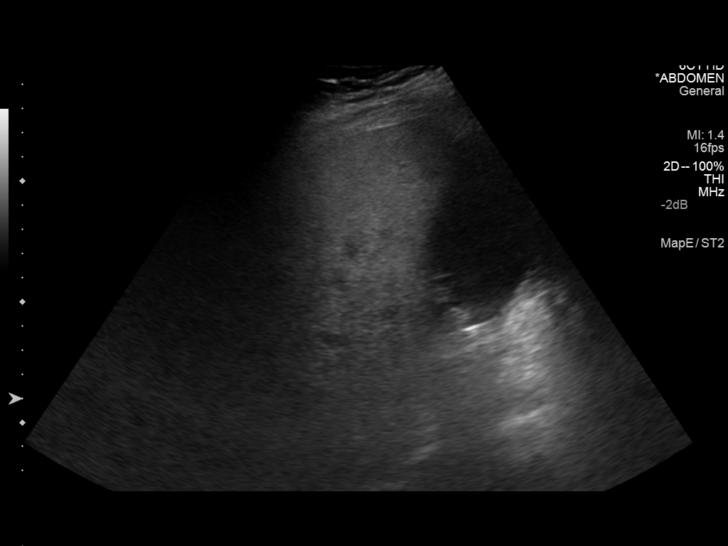
[im 22/64]
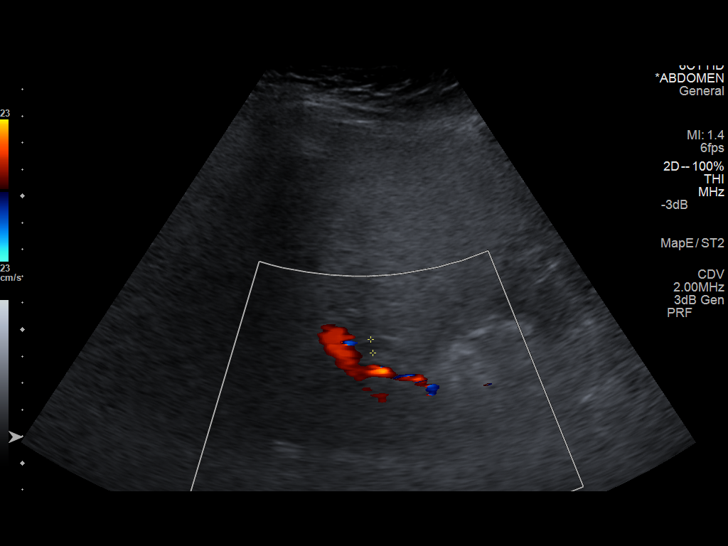
[im 24/64]
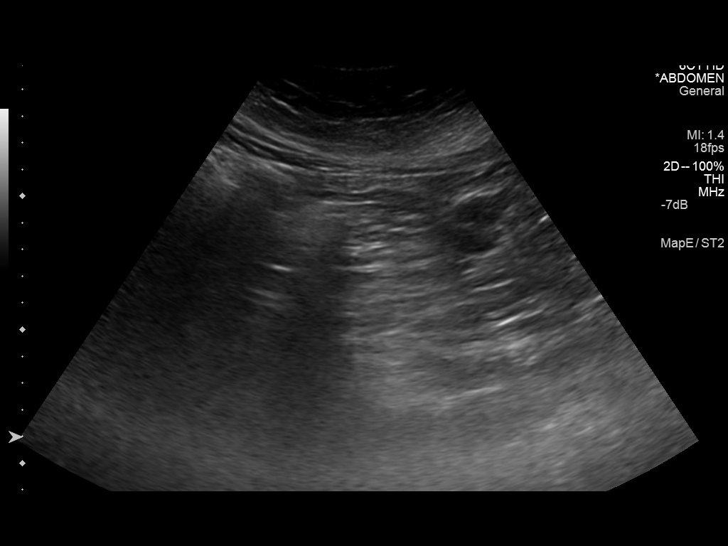
[im 29/64]
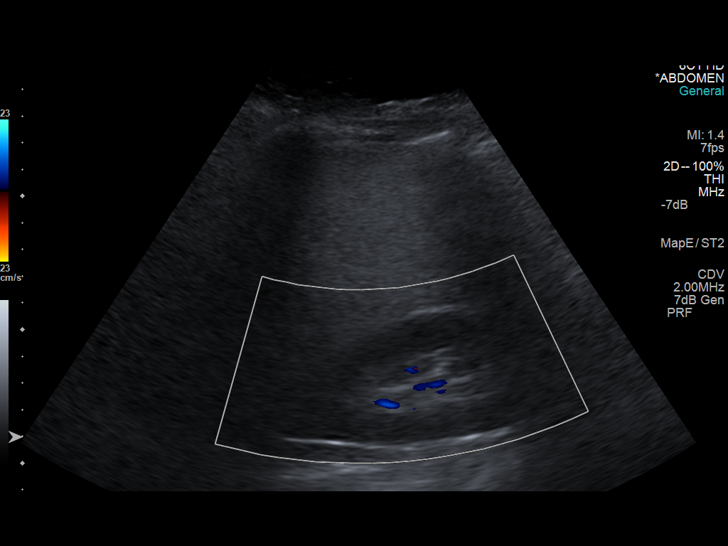
[im 35/64]
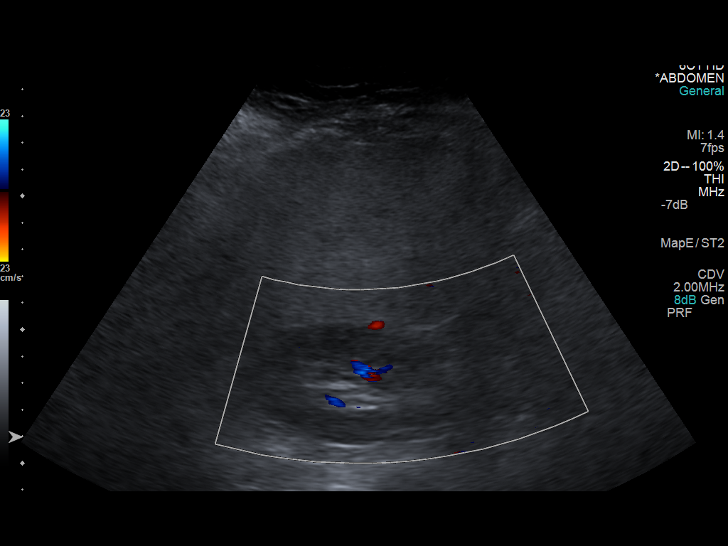
[im 40/64]
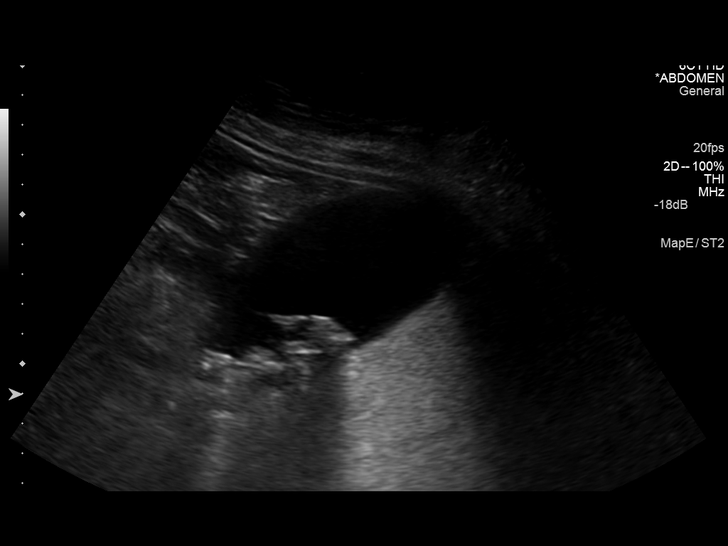
[im 43/64]
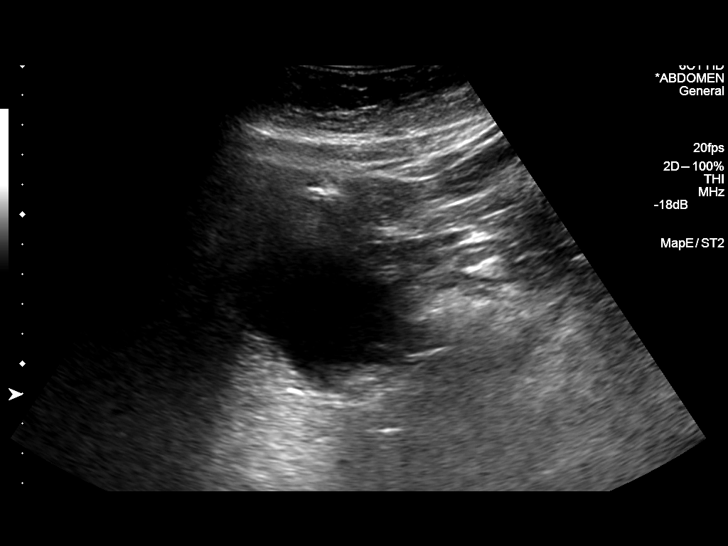
[im 48/64]
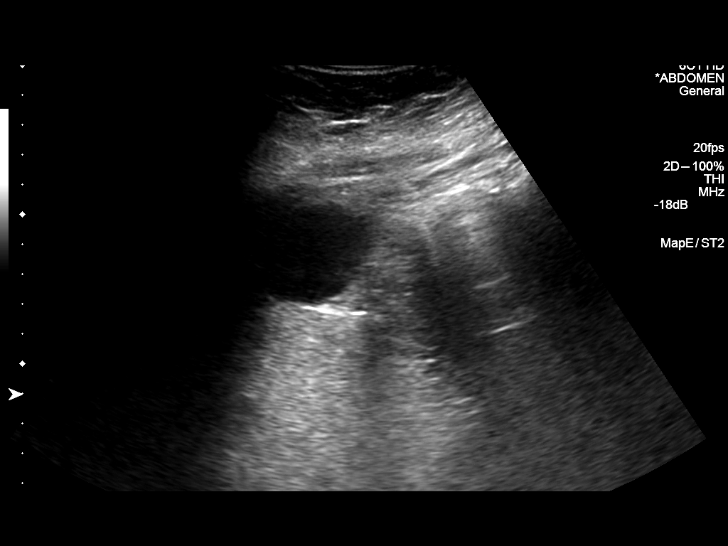
[im 53/64]
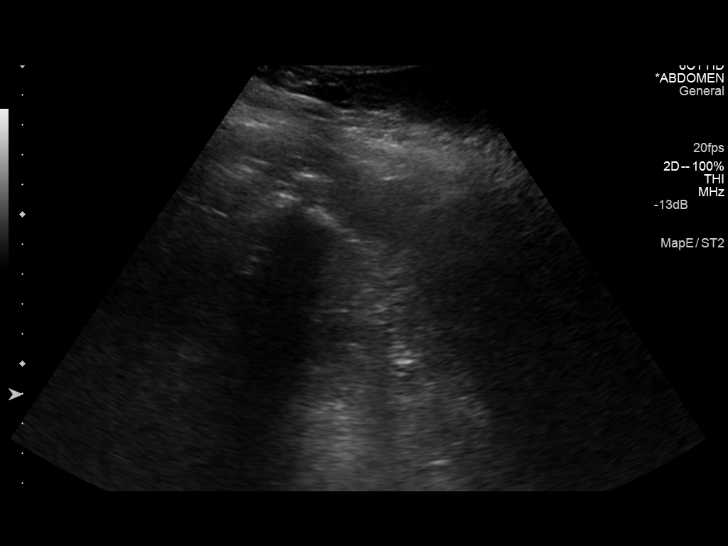
[im 58/64]
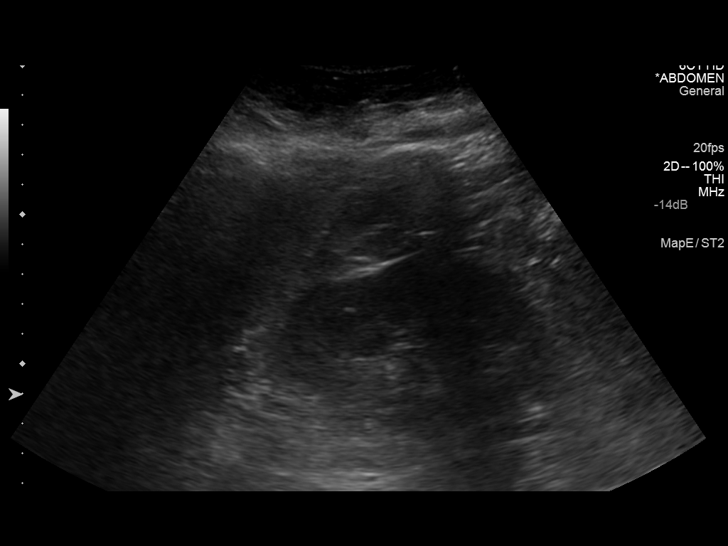
[im 64/64]
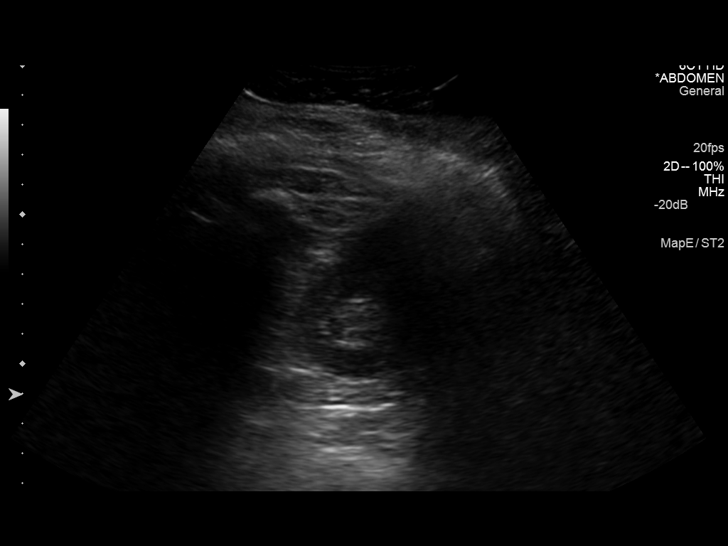

[14 of 25 positions shown; findings below may reference images not displayed]

FINDINGS: Gallbladder: Multiple echogenic gallstones, difficult to discretely
measure, demonstrating acoustic shadowing. Mild gallbladder
distention with bowel wall thickening or pericholecystic fluid. No
sonographic Murphy's sign elicited.

Common bile duct: Diameter: 5 mm

Liver: Diffusely echogenic without intrahepatic biliary dilatation.
Hepatopetal portal vein.

IVC: No abnormality visualized.

Pancreas: Obscured, likely by bowel gas.

Spleen: Size and appearance within normal limits.

Right Kidney: Length: 9.6 cm. Echogenicity within normal limits. No
mass or hydronephrosis visualized.

Left Kidney: Length: 10.5 cm. Echogenicity within normal limits. No
mass or hydronephrosis visualized.

Abdominal aorta: Obscured, likely by bowel gas.

Other findings: None.
IMPRESSION: Cholelithiasis and mild gallbladder distention without sonographic
findings of acute cholecystitis.

Hepatic steatosis.

  By: Gomby Kartal

## 2015-10-09 IMAGING — RF DG CHOLANGIOGRAM OPERATIVE
1 series · 4 of 4 positions shown · non-contrast
Comparison: Ultrasound on 12/17/2013

CLINICAL DATA: Cholecystectomy for cholelithiasis and biliary
pancreatitis.

EXAM:
INTRAOPERATIVE CHOLANGIOGRAM
TECHNIQUE: Cholangiographic images from the C-arm fluoroscopic device were
submitted for interpretation post-operatively. Please see the
procedural report for the amount of contrast and the fluoroscopy
time utilized.

[Series 1: run · 4 of 87 frames shown]
[frame 6/87]
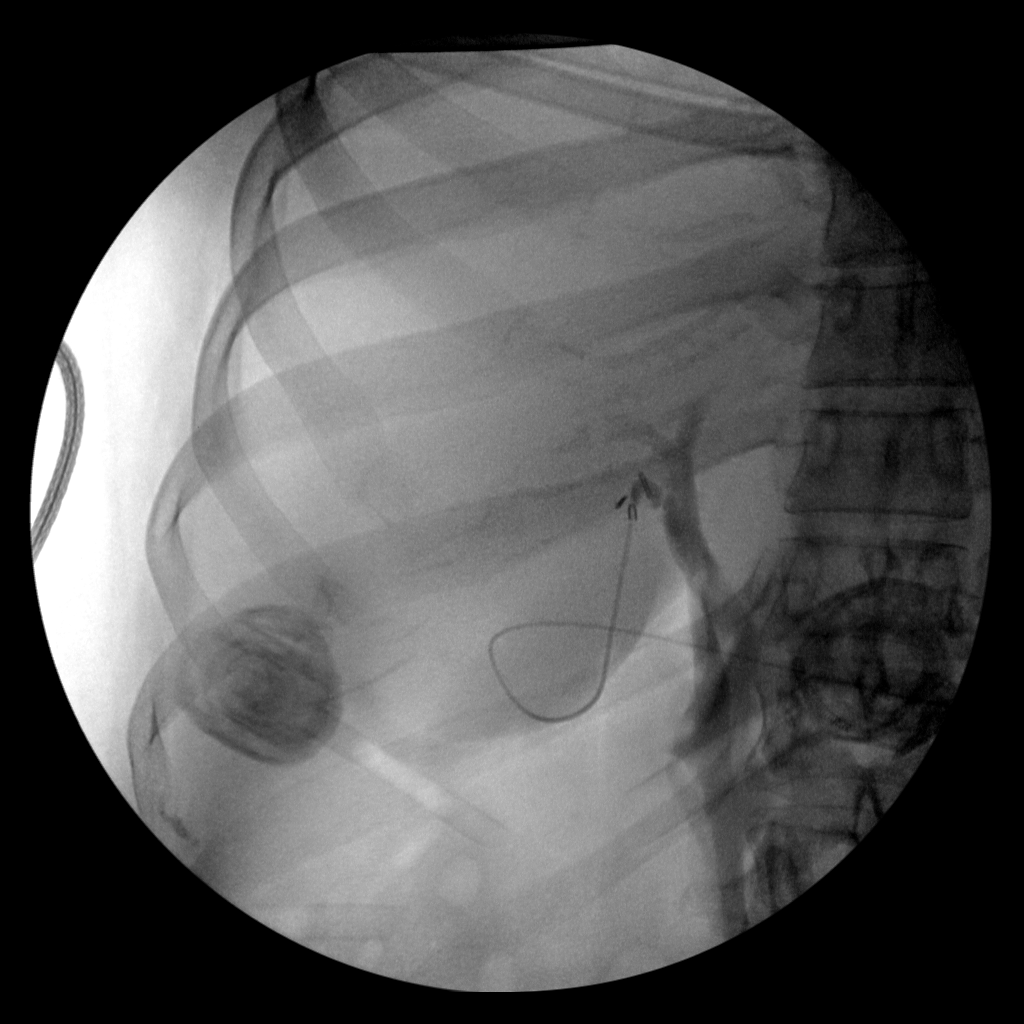
[frame 14/87]
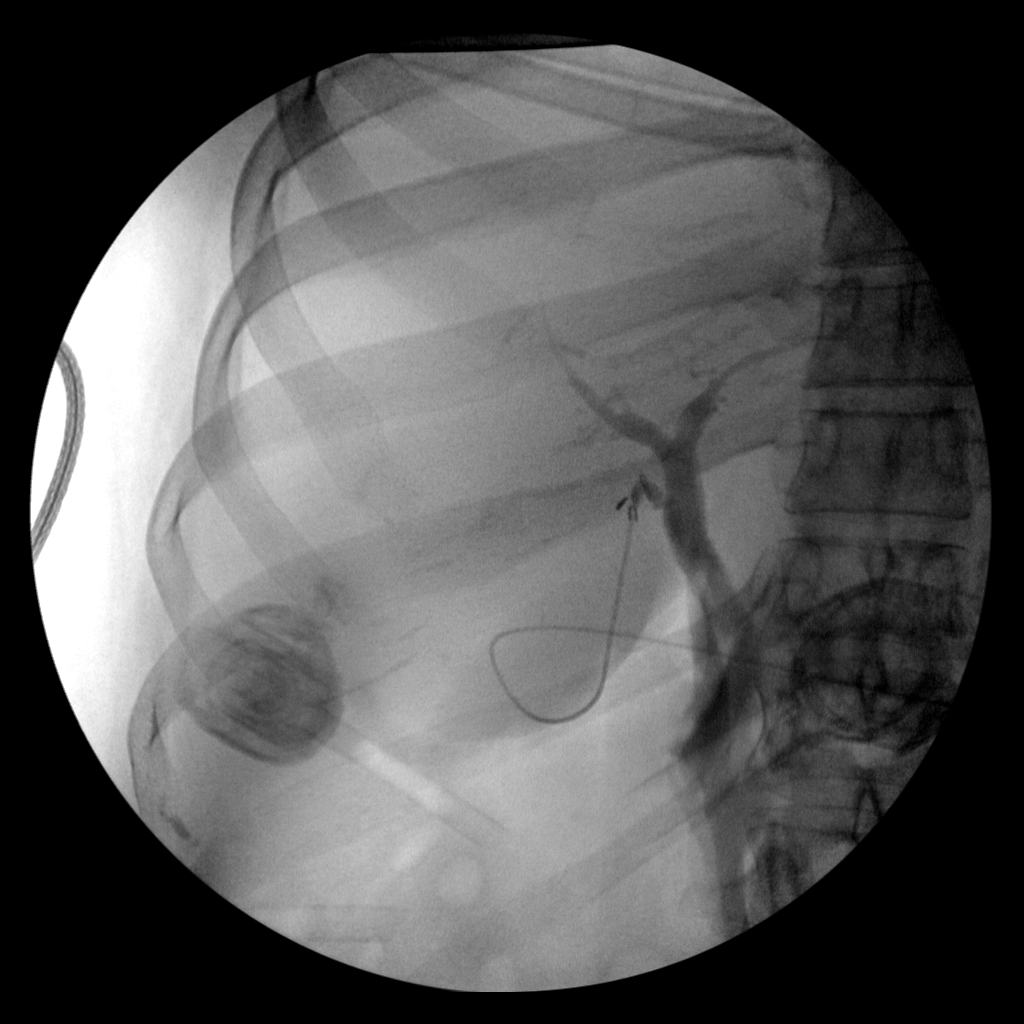
[frame 44/87]
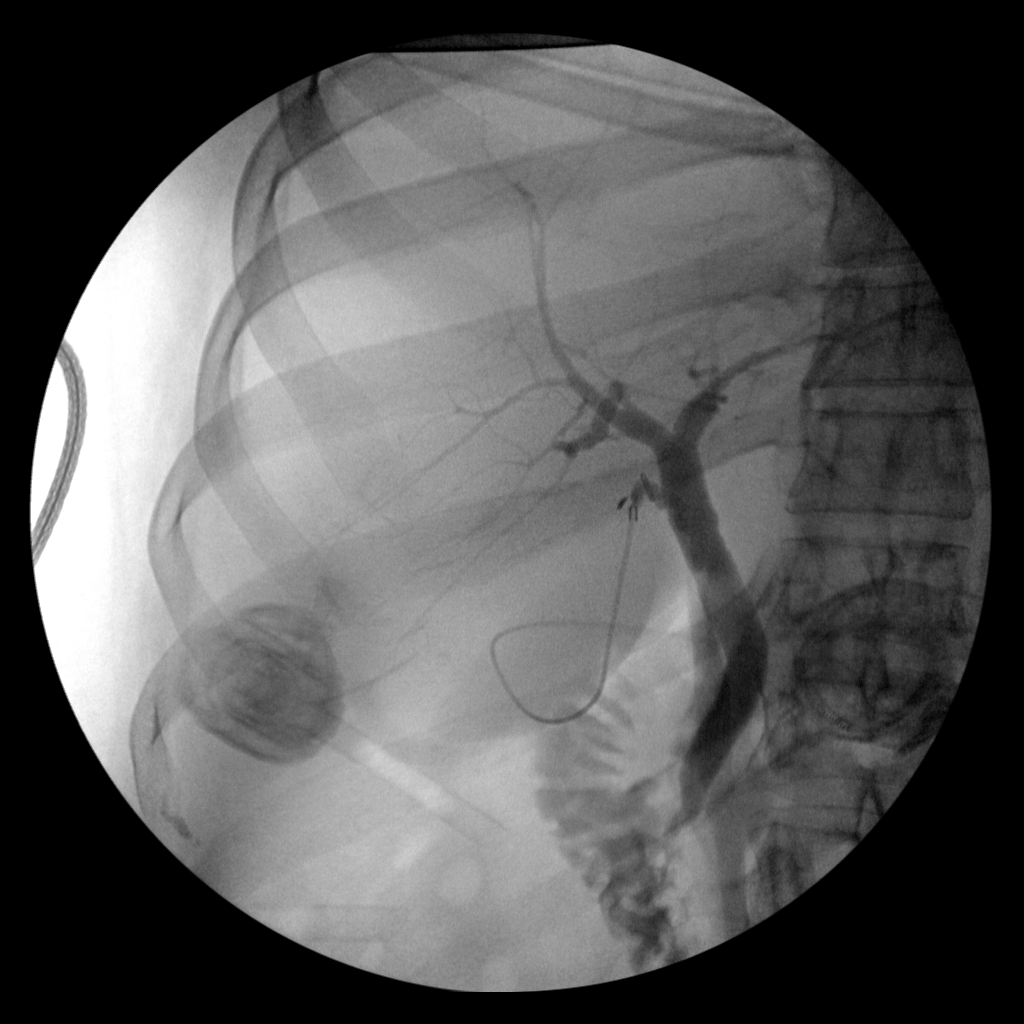
[frame 74/87]
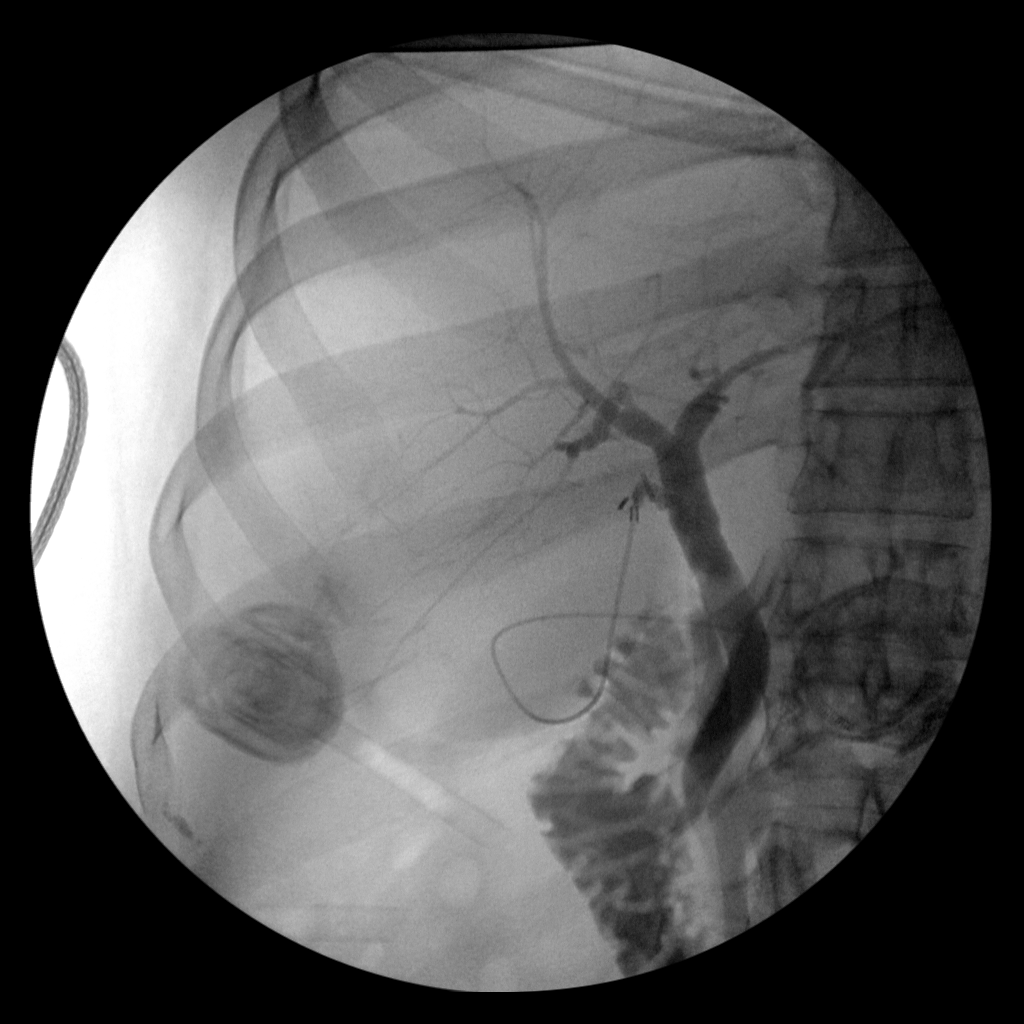

[4 of 4 positions shown; findings below may reference images not displayed]

FINDINGS: Intraoperative cholangiogram obtained with a C-arm demonstrates no
evidence of biliary filling defects. The common bile duct is top
normal in caliber. Contrast enters the duodenum normally. No
contrast extravasation identified.
IMPRESSION: Unremarkable intraoperative cholangiogram.

## 2019-05-12 ENCOUNTER — Other Ambulatory Visit: Payer: Self-pay

## 2019-05-12 DIAGNOSIS — Z1231 Encounter for screening mammogram for malignant neoplasm of breast: Secondary | ICD-10-CM

## 2019-06-15 ENCOUNTER — Other Ambulatory Visit: Payer: Self-pay

## 2019-06-15 ENCOUNTER — Ambulatory Visit
Admission: RE | Admit: 2019-06-15 | Discharge: 2019-06-15 | Disposition: A | Discharge status: Home / Self Care | Payer: No Typology Code available for payment source | Source: Ambulatory Visit | Attending: Obstetrics and Gynecology | Admitting: Obstetrics and Gynecology

## 2019-06-15 ENCOUNTER — Ambulatory Visit: Payer: Self-pay | Admitting: *Deleted

## 2019-06-15 VITALS — BP 128/76 | Temp 97.5°F | Wt 163.9 lb

## 2019-06-15 DIAGNOSIS — Z01419 Encounter for gynecological examination (general) (routine) without abnormal findings: Secondary | ICD-10-CM

## 2019-06-15 DIAGNOSIS — Z124 Encounter for screening for malignant neoplasm of cervix: Secondary | ICD-10-CM

## 2019-06-15 DIAGNOSIS — Z1231 Encounter for screening mammogram for malignant neoplasm of breast: Secondary | ICD-10-CM

## 2019-06-15 NOTE — Patient Instructions (Signed)
Explained breast self awareness with [name]. Let patient know [facility] will cover Pap smears and HPV typing every 5 years unless has a history of abnormal Pap smears. [facility]erred [facility] to [facility] for a screening mammogram. Appointment scheduled Tuesday, Jun 15, 2019 at 1110. Patient aware of appointment and will be there. Let patient know will follow up with her within the next couple weeks with results of her Pap smear by letter or phone. Informed patient that the Breast Center will follow-up with her within the next couple of weeks with results of her Pap smear by letter or phone. Jera Headings verbalized understanding.  Matteson Blue, Kathaleen Maser, RN 9:42 AM

## 2019-06-15 NOTE — Progress Notes (Signed)
[name] is a 44 y.o. No obstetric history on file. female who presents to [facility] clinic today with no complaints.    Pap Smear: Pap smear completed today. Last Pap smear was 5 years ago in [place] and normal per patient. Per patient has no history of an abnormal Pap smear. No Pap smear result are in Epic.   Physical exam: Breasts Breasts symmetrical. No skin abnormalities bilateral breasts. No nipple retraction bilateral breasts. No nipple discharge bilateral breasts. No lymphadenopathy. No lumps palpated bilateral breasts. No complaints of pain or tenderness on exam.       Pelvic/Bimanual Ext Genitalia No lesions, no swelling and no discharge observed on external genitalia.        Vagina Vagina pink and normal texture. No lesions or discharge observed in vagina.        Cervix Cervix is present. Cervix pink and of normal texture. No discharge observed.    Uterus Uterus is present and palpable. Uterus in normal position and normal size.        Adnexae Bilateral ovaries present and palpable. No tenderness on palpation.         Rectovaginal No rectal exam completed today since patient had no rectal complaints. No skin abnormalities observed on exam.     Smoking History: Patient has never smoked.   Patient Navigation: Patient education provided. Access to services provided for patient through [facility] program. Spanish interpreter Natale Lay from [facility] provided.   Breast and Cervical Cancer Risk Assessment: Patient has no family history of breast cancer, known genetic mutations, or radiation treatment to the chest before age 90. Patient has no history of cervical dysplasia, immunocompromised, or DES exposure in-utero.  Risk Assessment    Risk Scores      06/15/2019   Last edited by: Narda Rutherford, LPN   5-year risk: 0.4 %   Lifetime risk: 4.4 %          A: [facility] exam with pap smear  P: Referred patient to the [facility] for a screening  mammogram. Appointment scheduled Tuesday, Jun 15, 2019 at 1110.  Priscille Heidelberg, RN 06/15/2019 9:42 AM

## 2019-06-16 ENCOUNTER — Other Ambulatory Visit: Payer: Self-pay | Admitting: Obstetrics and Gynecology

## 2019-06-16 DIAGNOSIS — R928 Other abnormal and inconclusive findings on diagnostic imaging of breast: Secondary | ICD-10-CM

## 2019-06-16 LAB — CYTOLOGY - PAP
Comment: NEGATIVE
Diagnosis: NEGATIVE
High risk HPV: NEGATIVE

## 2019-06-25 ENCOUNTER — Telehealth: Payer: Self-pay

## 2019-06-25 NOTE — Telephone Encounter (Signed)
**Note De-Identified Ashley Owens Obfuscation** Ashley Owens, Delorise Royals, Spanish Interpreter, patient informed normal Pap/HPV results, and follow-up mammogram is 07/06/2019 [facility] 1[facility]pm [facility]. Patient verbalized understanding.

## 2019-07-06 ENCOUNTER — Other Ambulatory Visit: Payer: No Typology Code available for payment source

## 2019-07-21 ENCOUNTER — Other Ambulatory Visit: Payer: No Typology Code available for payment source

## 2019-07-26 ENCOUNTER — Encounter: Payer: No Typology Code available for payment source | Admitting: Obstetrics & Gynecology

## 2019-11-23 ENCOUNTER — Other Ambulatory Visit: Payer: Self-pay | Admitting: Obstetrics and Gynecology

## 2019-11-23 ENCOUNTER — Ambulatory Visit
Admission: RE | Admit: 2019-11-23 | Discharge: 2019-11-23 | Disposition: A | Discharge status: Home / Self Care | Payer: No Typology Code available for payment source | Source: Ambulatory Visit | Attending: Obstetrics and Gynecology | Admitting: Obstetrics and Gynecology

## 2019-11-23 ENCOUNTER — Other Ambulatory Visit: Payer: Self-pay

## 2019-11-23 DIAGNOSIS — R928 Other abnormal and inconclusive findings on diagnostic imaging of breast: Secondary | ICD-10-CM

## 2019-12-03 ENCOUNTER — Inpatient Hospital Stay: Admission: RE | Admit: 2019-12-03 | Payer: No Typology Code available for payment source | Source: Ambulatory Visit

## 2019-12-07 ENCOUNTER — Ambulatory Visit
Admission: RE | Admit: 2019-12-07 | Discharge: 2019-12-07 | Disposition: A | Discharge status: Home / Self Care | Payer: No Typology Code available for payment source | Source: Ambulatory Visit | Attending: Obstetrics and Gynecology | Admitting: Obstetrics and Gynecology

## 2019-12-07 ENCOUNTER — Other Ambulatory Visit: Payer: Self-pay

## 2019-12-07 DIAGNOSIS — R928 Other abnormal and inconclusive findings on diagnostic imaging of breast: Secondary | ICD-10-CM

## 2020-04-25 ENCOUNTER — Other Ambulatory Visit: Payer: Self-pay

## 2020-04-25 DIAGNOSIS — Z853 Personal history of malignant neoplasm of breast: Secondary | ICD-10-CM

## 2020-04-25 DIAGNOSIS — Z08 Encounter for follow-up examination after completed treatment for malignant neoplasm: Secondary | ICD-10-CM

## 2020-06-20 ENCOUNTER — Ambulatory Visit
Admission: RE | Admit: 2020-06-20 | Discharge: 2020-06-20 | Disposition: A | Discharge status: Home / Self Care | Payer: No Typology Code available for payment source | Source: Ambulatory Visit | Attending: Obstetrics and Gynecology | Admitting: Obstetrics and Gynecology

## 2020-06-20 ENCOUNTER — Ambulatory Visit: Payer: No Typology Code available for payment source | Admitting: *Deleted

## 2020-06-20 ENCOUNTER — Other Ambulatory Visit: Payer: Self-pay

## 2020-06-20 ENCOUNTER — Encounter (INDEPENDENT_AMBULATORY_CARE_PROVIDER_SITE_OTHER): Payer: Self-pay

## 2020-06-20 VITALS — BP 136/88 | Wt 161.4 lb

## 2020-06-20 DIAGNOSIS — Z08 Encounter for follow-up examination after completed treatment for malignant neoplasm: Secondary | ICD-10-CM

## 2020-06-20 DIAGNOSIS — Z853 Personal history of malignant neoplasm of breast: Secondary | ICD-10-CM

## 2020-06-20 DIAGNOSIS — Z1239 Encounter for other screening for malignant neoplasm of breast: Secondary | ICD-10-CM

## 2020-06-20 NOTE — Patient Instructions (Signed)
Explained breast self awareness with [name]. Patient did not need a Pap smear today due to last Pap smear and HPV typing was 06/15/2019. Let her know [facility] will cover Pap smears and HPV typing every 5 years unless has a history of abnormal Pap smears. Referred patient to the [facility] for a diagnostic mammogram per recommendation. Appointment scheduled Tuesday, Jun 20, 2020 at 0940. Patient aware of appointment and will be there. Gwendola Hornaday verbalized understanding.  Cotton Beckley, Kathaleen Maser, RN 8:27 AM

## 2020-06-20 NOTE — Progress Notes (Signed)
Ashley Owens is a 45 y.o. female who presents to [facility] clinic today with no complaints. Patient had a breast biopsy completed 12/07/2019 that a bilateral diagnostic mammogram and right breast ultrasound was recommended for follow-up in May 2022.   Pap Smear: Pap smear not completed today. Last Pap smear was 06/15/2019 at [facility] clinic and was normal with negative HPV. Per patient has no history of an abnormal Pap smear. Last Pap smear result is available in Epic.   Physical exam: Breasts Breasts symmetrical. No skin abnormalities bilateral breasts. No nipple retraction bilateral breasts. No nipple discharge bilateral breasts. No lymphadenopathy. No lumps palpated bilateral breasts. No complaints of pain or tenderness on exam.      MM DIAG BREAST TOMO BILATERAL  Result Date: 11/23/2019 CLINICAL DATA:  45 year old female recalled from baseline screening mammogram dated 06/15/2019 for possible bilateral breast masses. EXAM: DIGITAL DIAGNOSTIC BILATERAL MAMMOGRAM WITH CAD AND TOMO ULTRASOUND BILATERAL BREAST COMPARISON:  Previous exam(s). ACR Breast Density Category b: There are scattered areas of fibroglandular density. FINDINGS: There are multiple persistent oval, circumscribed equal density masses in the upper-outer quadrant of the right breast. There is a persistent oval, circumscribed equal density mass in the subareolar left breast. Further evaluation with ultrasound was performed. Mammographic images were processed with CAD. Targeted ultrasound is performed, showing an oval, slightly irregular hypoechoic mass at the 11 o'clock position 2 cm from the nipple on the right. Measures 1.4 x 1.1 x 0.6 cm. There is mild associated vascularity. An additional smaller oval, circumscribed isoechoic mass is identified at the deep 10 o'clock position 3 cm from the nipple. It measures 5 x 4 x 2 mm. There is no internal vascularity. This likely corresponds with the mammographically identified mass. Note is made of  multiple other circumscribed cystic masses at the 9:30 position 2 cm from the nipple and 9 o'clock position 3 cm from the nipple. This likely corresponds with additional mammographic masses. There is no suspicious right axillary lymphadenopathy. Targeted evaluation of the left breast demonstrates a 4 x 3 x 2 mm circumscribed cyst at the 630 retroareolar position. This likely corresponds with the mammographic finding. IMPRESSION: 1. Indeterminate right breast mass at the 11 o'clock position 2 cm from the nipple. Recommendation is for ultrasound-guided biopsy. 2. Probably benign, isoechoic mass at the 10 o'clock position 3 cm from the nipple on the right. This likely corresponds with the mammographic finding. 3. No suspicious right axillary lymphadenopathy. 4. Multiple benign circumscribed masses corresponding with simple and minimally complicated cysts bilaterally. No further imaging follow-up recommended for these. RECOMMENDATION: 1. Ultrasound-guided biopsy of the right breast mass at the 11 o'clock position 2 cm from the nipple. 2. If this demonstrates benign pathology, follow-up bilateral mammogram and right breast ultrasound is recommended in May 2022 for the right breast mass at the 10 o'clock position. I have discussed the findings and recommendations with the patient. If applicable, a reminder letter will be sent to the patient regarding the next appointment. BI-RADS CATEGORY  4: Suspicious. Electronically Signed   By: Sande Brothers M.D.   On: 11/23/2019 16:26   MS DIGITAL SCREENING TOMO BILATERAL  Result Date: 06/15/2019 CLINICAL DATA:  Screening. This is the patient's initial baseline mammogram. EXAM: DIGITAL SCREENING BILATERAL MAMMOGRAM WITH TOMO AND CAD COMPARISON:  None. ACR Breast Density Category b: There are scattered areas of fibroglandular density. FINDINGS: In the right breast a possible mass requires further evaluation. In the left breast a possible mass requires further evaluation. Images  were processed with CAD. IMPRESSION: Further evaluation is suggested for a possible mass in the right breast. Further evaluation is suggested for a possible mass in the left breast. RECOMMENDATION: Diagnostic mammogram and possibly ultrasound of both breasts. (Code:[id number]) The patient will be contacted regarding the findings, and additional imaging will be scheduled. BI-RADS CATEGORY  0: Incomplete. Need additional imaging evaluation and/or prior mammograms for comparison. Electronically Signed   By: Hulan Saas M.D.   On: 06/15/2019 16:47   MM CLIP PLACEMENT RIGHT  Result Date: 12/07/2019 CLINICAL DATA:  Post procedure mammogram for clip placement. EXAM: DIAGNOSTIC RIGHT MAMMOGRAM POST ULTRASOUND BIOPSY COMPARISON:  Previous exam(s). FINDINGS: Mammographic images were obtained following ultrasound guided biopsy of a right breast mass at 11 o'clock. The biopsy marking clip is in expected position at the site of biopsy. IMPRESSION: Appropriate positioning of the ribbon shaped biopsy marking clip at the site of biopsy in the right breast at 11 o'clock. Final Assessment: Post Procedure Mammograms for Marker Placement Electronically Signed   By: Emmaline Kluver M.D.   On: 12/07/2019 12:07    Pelvic/Bimanual Pap is not indicated today per BCCCP guidelines.   Smoking History: Patient has never smoked.   Patient Navigation: Patient education provided. Access to services provided for patient through [facility] program. Spanish interpreter Natale Lay from [facility] provided.   Colorectal Cancer Screening: Per patient has never had colonoscopy completed. No complaints today.    Breast and Cervical Cancer Risk Assessment: Patient does not have family history of breast cancer, known genetic mutations, or radiation treatment to the chest before age 89. Patient does not have history of cervical dysplasia, immunocompromised, or DES exposure in-utero.  Risk Assessment    Risk Scores      06/20/2020  06/15/2019   Last edited by: Narda Rutherford, LPN Lawernce Keas, RT   5-year risk: 0.4 % 0.4 %   Lifetime risk: 4.4 % 4.4 %         A: BCCCP exam without pap smear No complaints.  P: [facility] to [facility] for a diagnostic mammogram per recommendation. Appointment scheduled Tuesday, Jun 20, 2020 at 0940.  Priscille Heidelberg, RN 06/20/2020 8:24 AM

## 2021-09-27 IMAGING — US US  BREAST BX W/ LOC DEV 1ST LESION IMG BX SPEC US GUIDE*R*
1 series · 11 of 11 positions shown · non-contrast
Comparison: Previous exam(s).
COMPARISON: Previous exam(s).

Addendum:
CLINICAL DATA: 44-year-old female presenting for biopsy of a right
breast mass.

EXAM:
ULTRASOUND GUIDED RIGHT BREAST CORE NEEDLE BIOPSY

[Series 1: us breast bx w/ loc dev 1st lesion img bx spec us  · 0.06mm/px · 11 of 11 slices shown]
[im 1/11]
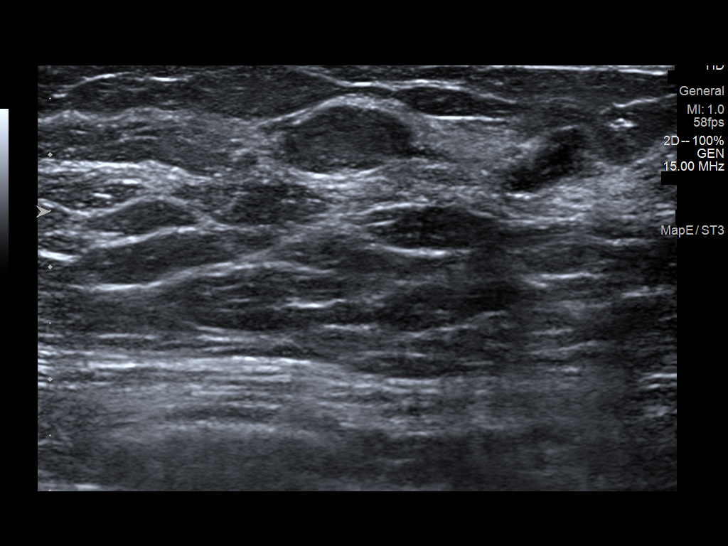
[im 2/11]
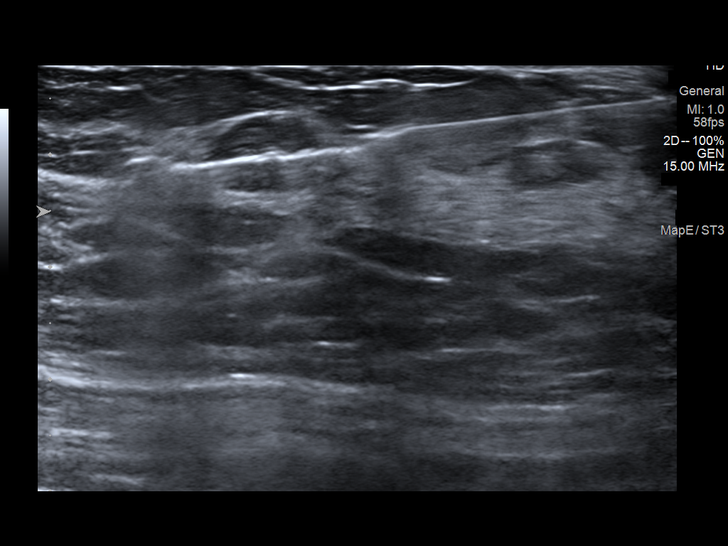
[im 3/11]
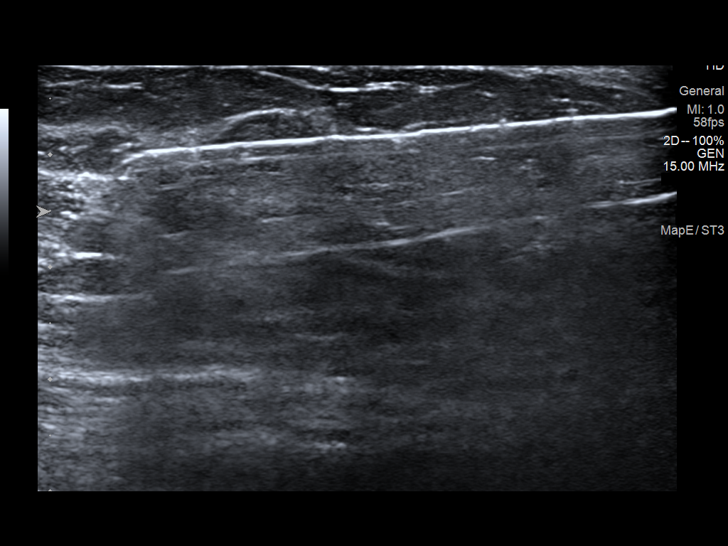
[im 4/11]
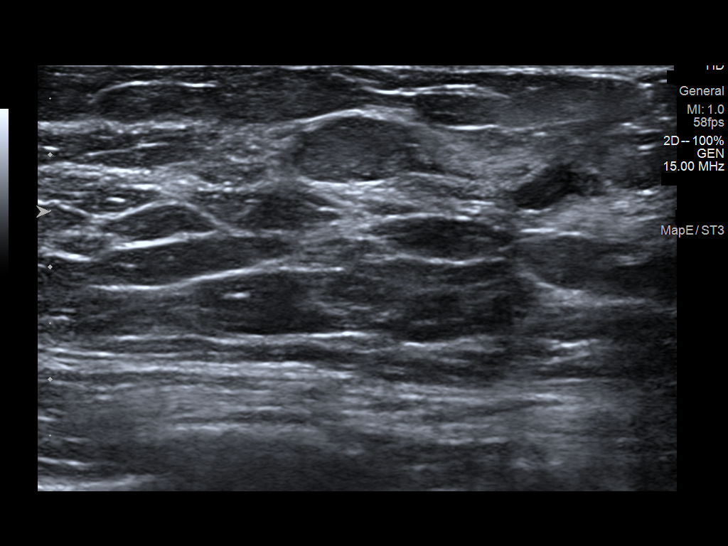
[im 5/11]
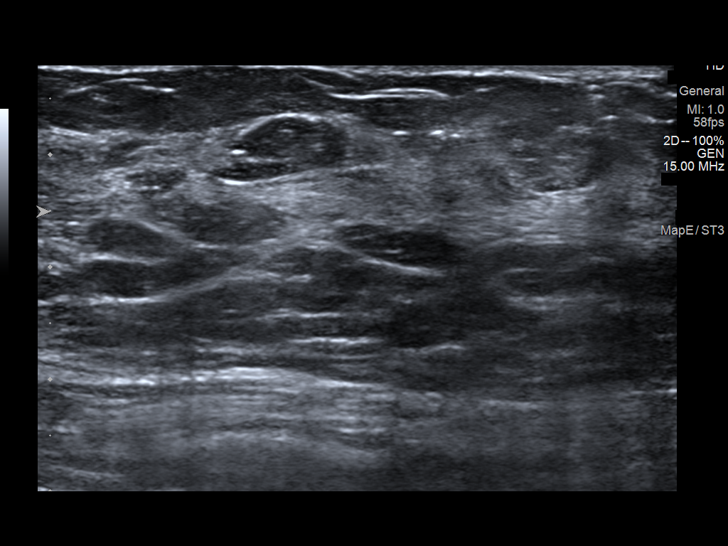
[im 6/11]
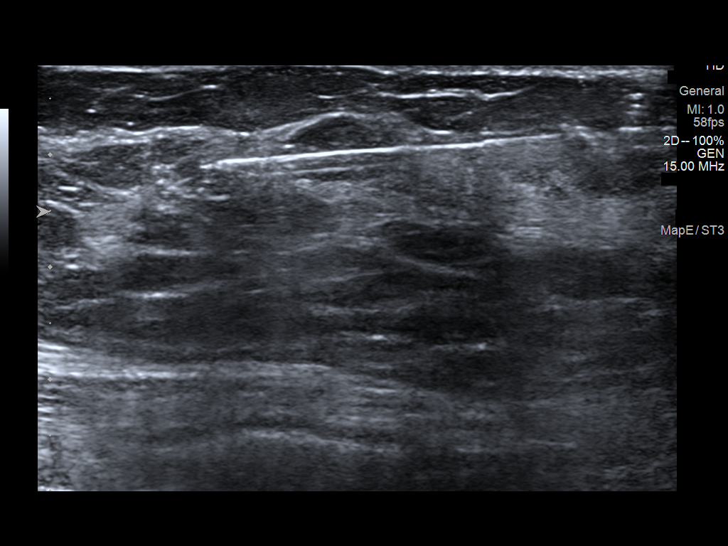
[im 7/11]
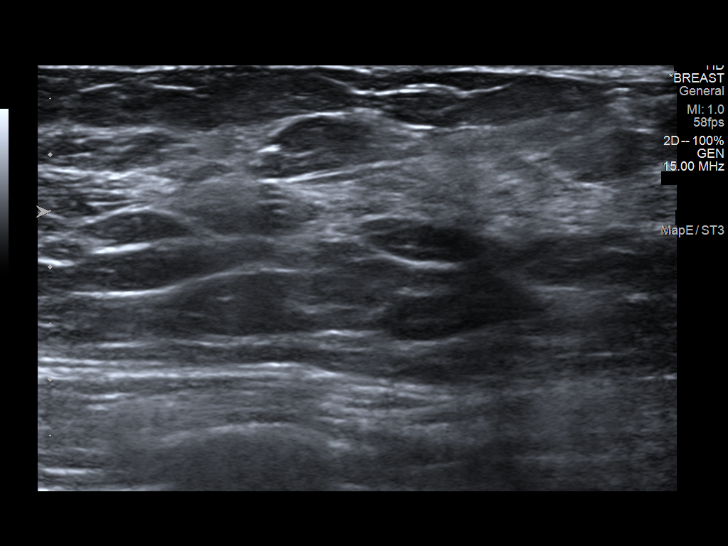
[im 8/11]
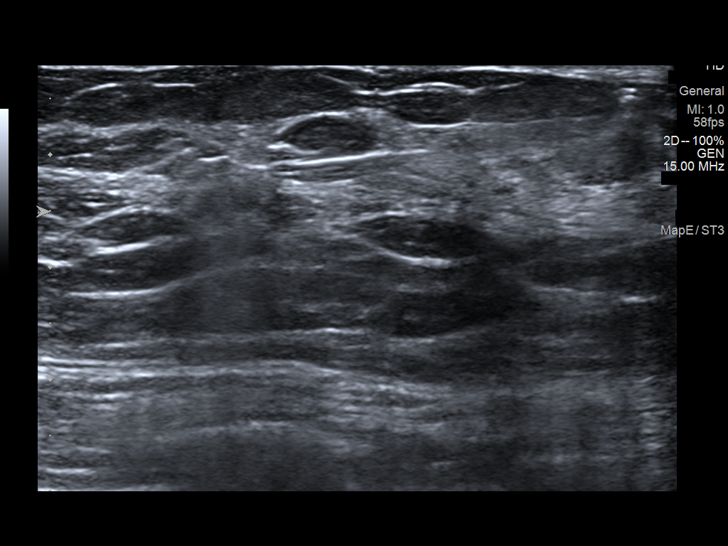
[im 9/11]
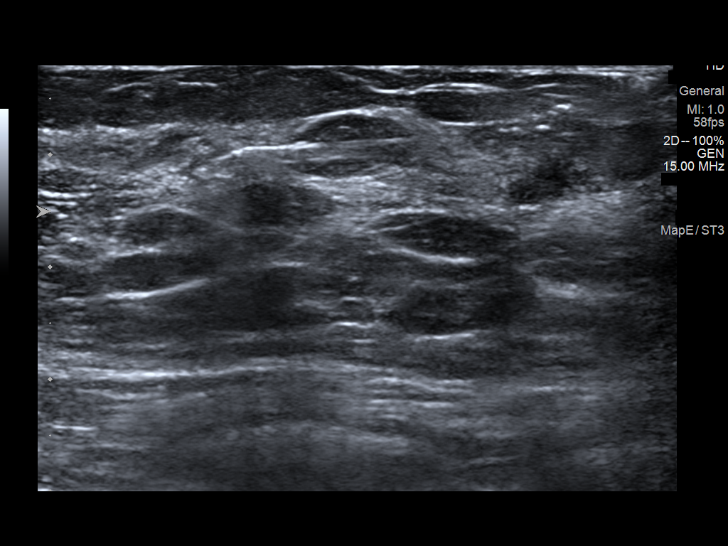
[im 10/11]
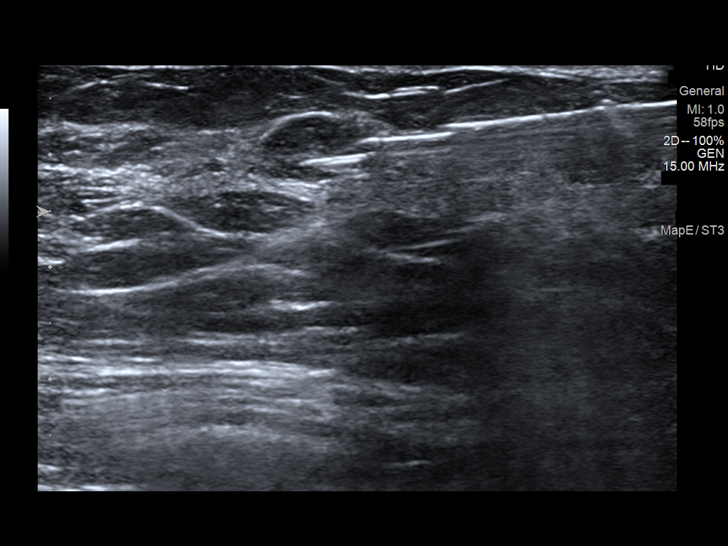
[im 11/11]
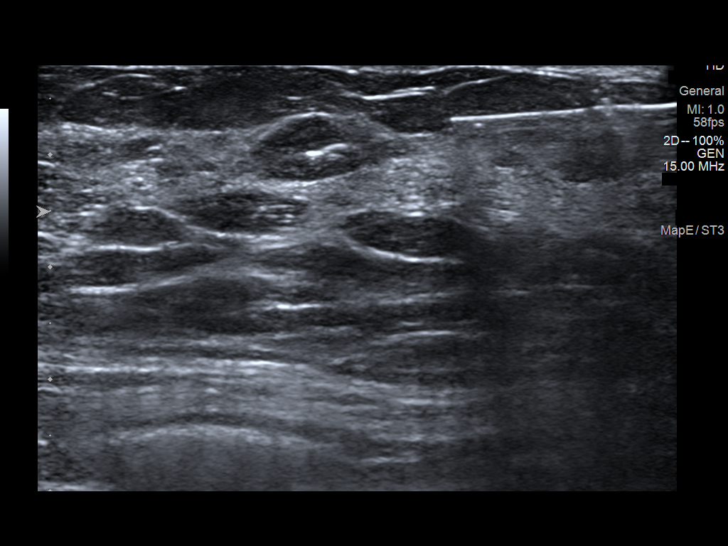

[11 of 11 positions shown; findings below may reference images not displayed]



Lesion quadrant: Upper outer quadrant

Using sterile technique and 1% Lidocaine as local anesthetic, under
direct ultrasound visualization, a 14 gauge Wada device was
used to perform biopsy of a right breast mass at 11 o'clock using a
lateral approach. At the conclusion of the procedure a ribbon tissue
marker clip was deployed into the biopsy cavity. Follow up 2 view
mammogram was performed and dictated separately.
IMPRESSION: Ultrasound guided biopsy of a right breast mass at 11 o'clock. No
apparent complications.

ADDENDUM:
Pathology revealed FIBROADENOMA, USUAL DUCTAL HYPERPLASIA AND
APOCRINE METAPLASIA of the RIGHT breast, 11:00 o'clock. This was
found to be concordant by Dr. Suongyaun Netshopper.

Pathology results were discussed with the patient by telephone by
patient reported doing well after the biopsy with tenderness at the
site. Post biopsy instructions and care were reviewed and questions
were answered. The patient was encouraged to call The [REDACTED]

The patient was asked to return for BILATERAL diagnostic mammography
and RIGHT breast ultrasound in May 2020 and informed a reminder
notice would be sent regarding this appointment.

Pathology results reported by Rudi Jumper, RN on 12/08/2019.



Lesion quadrant: Upper outer quadrant

Using sterile technique and 1% Lidocaine as local anesthetic, under
direct ultrasound visualization, a 14 gauge Wada device was
used to perform biopsy of a right breast mass at 11 o'clock using a
lateral approach. At the conclusion of the procedure a ribbon tissue
marker clip was deployed into the biopsy cavity. Follow up 2 view
mammogram was performed and dictated separately.
IMPRESSION: Ultrasound guided biopsy of a right breast mass at 11 o'clock. No
apparent complications.
